# Patient Record
Sex: Male | Born: 1970 | Hispanic: No | Marital: Married | Smoking: Never smoker
Health system: Southern US, Community
[De-identification: ages and names within clinical notes are randomized; demographics above are authoritative.]

## PROBLEM LIST (undated history)

## (undated) DIAGNOSIS — T7840XA Allergy, unspecified, initial encounter: Secondary | ICD-10-CM

## (undated) DIAGNOSIS — R011 Cardiac murmur, unspecified: Secondary | ICD-10-CM

## (undated) HISTORY — DX: Allergy, unspecified, initial encounter: T78.40XA

## (undated) HISTORY — DX: Cardiac murmur, unspecified: R01.1

## (undated) HISTORY — PX: KNEE ARTHROSCOPY AND ARTHROTOMY: SUR84

---

## 2011-02-03 ENCOUNTER — Ambulatory Visit: Payer: Self-pay | Admitting: Family Medicine

## 2012-06-30 ENCOUNTER — Ambulatory Visit: Payer: Self-pay | Admitting: Family Medicine

## 2013-07-02 ENCOUNTER — Ambulatory Visit: Payer: Self-pay | Admitting: Family Medicine

## 2014-04-24 DIAGNOSIS — L03019 Cellulitis of unspecified finger: Secondary | ICD-10-CM | POA: Insufficient documentation

## 2015-07-10 IMAGING — CR LEFT WRIST - COMPLETE 3+ VIEW
1 series · 4 of 4 positions shown · non-contrast
Comparison: None.

CLINICAL DATA: Pain

EXAM:
LEFT WRIST - COMPLETE 3+ VIEW

[Series 1: pa · 0.17mm/px · 4 of 4 slices shown]
[im 1/4]
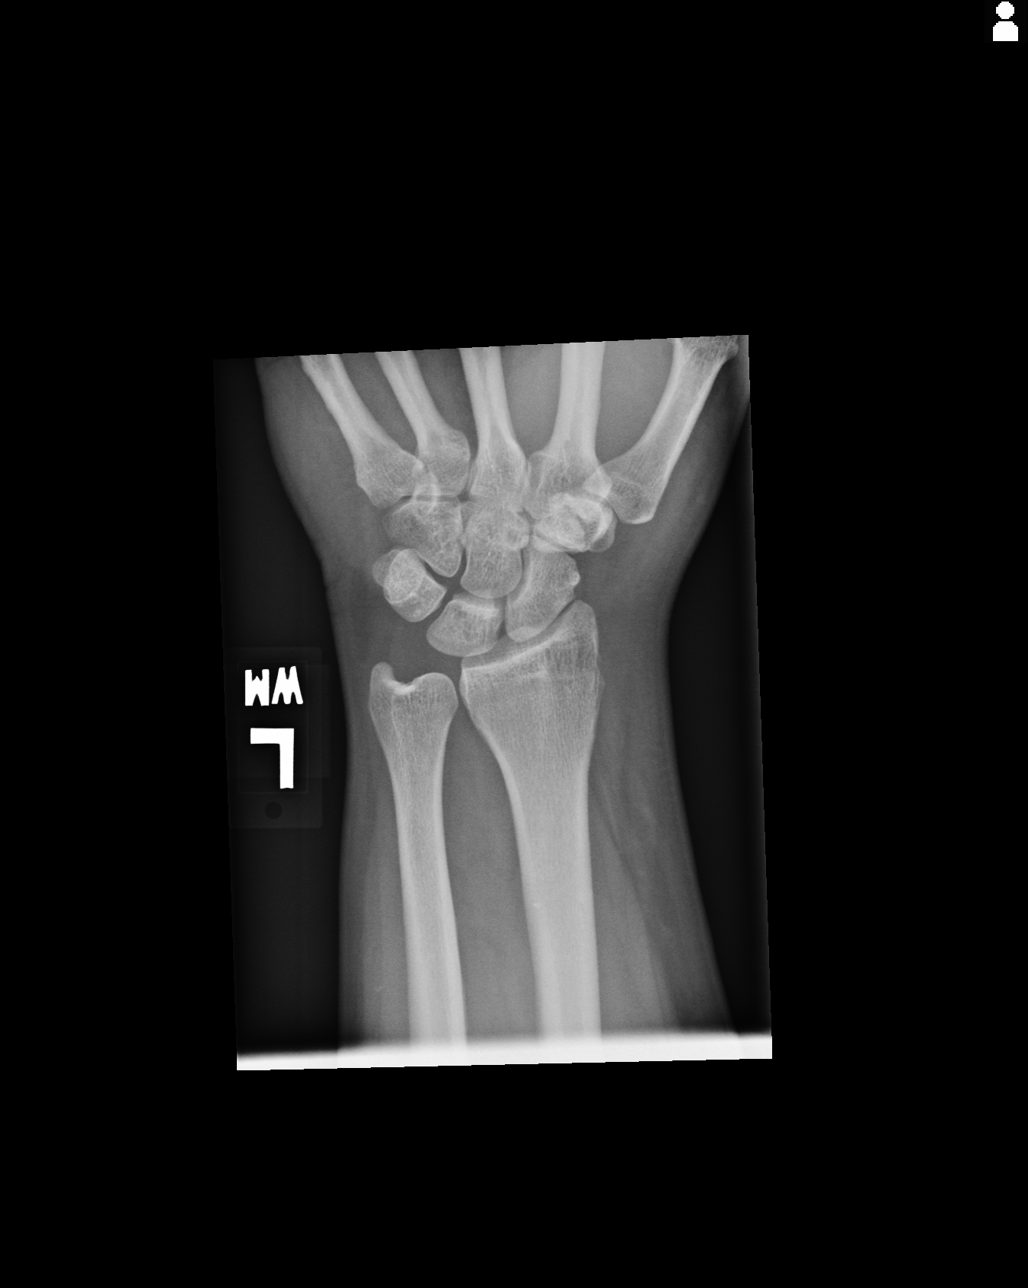
[im 2/4]
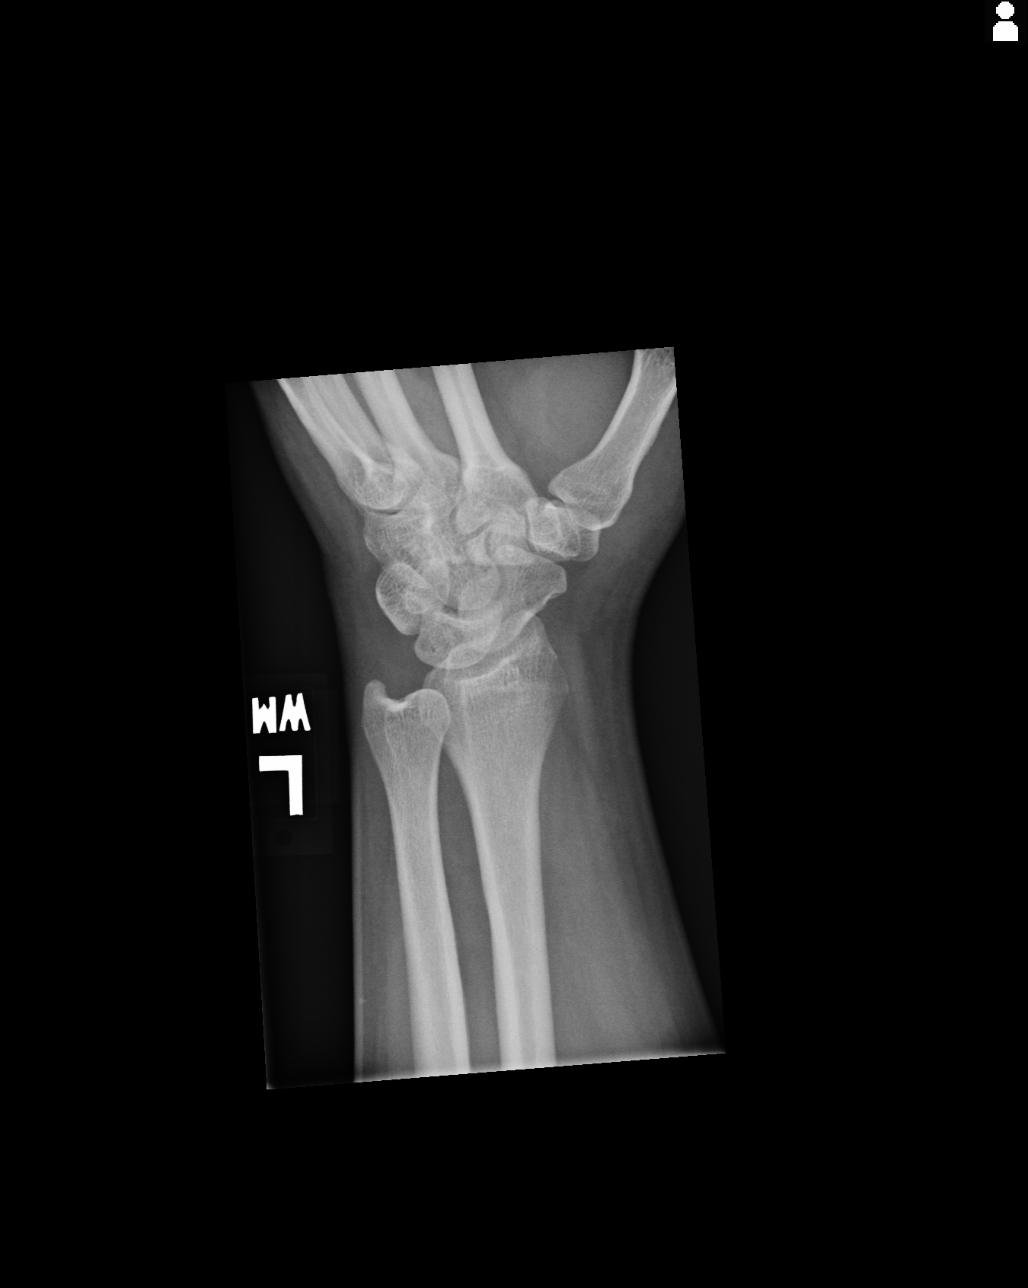
[im 3/4]
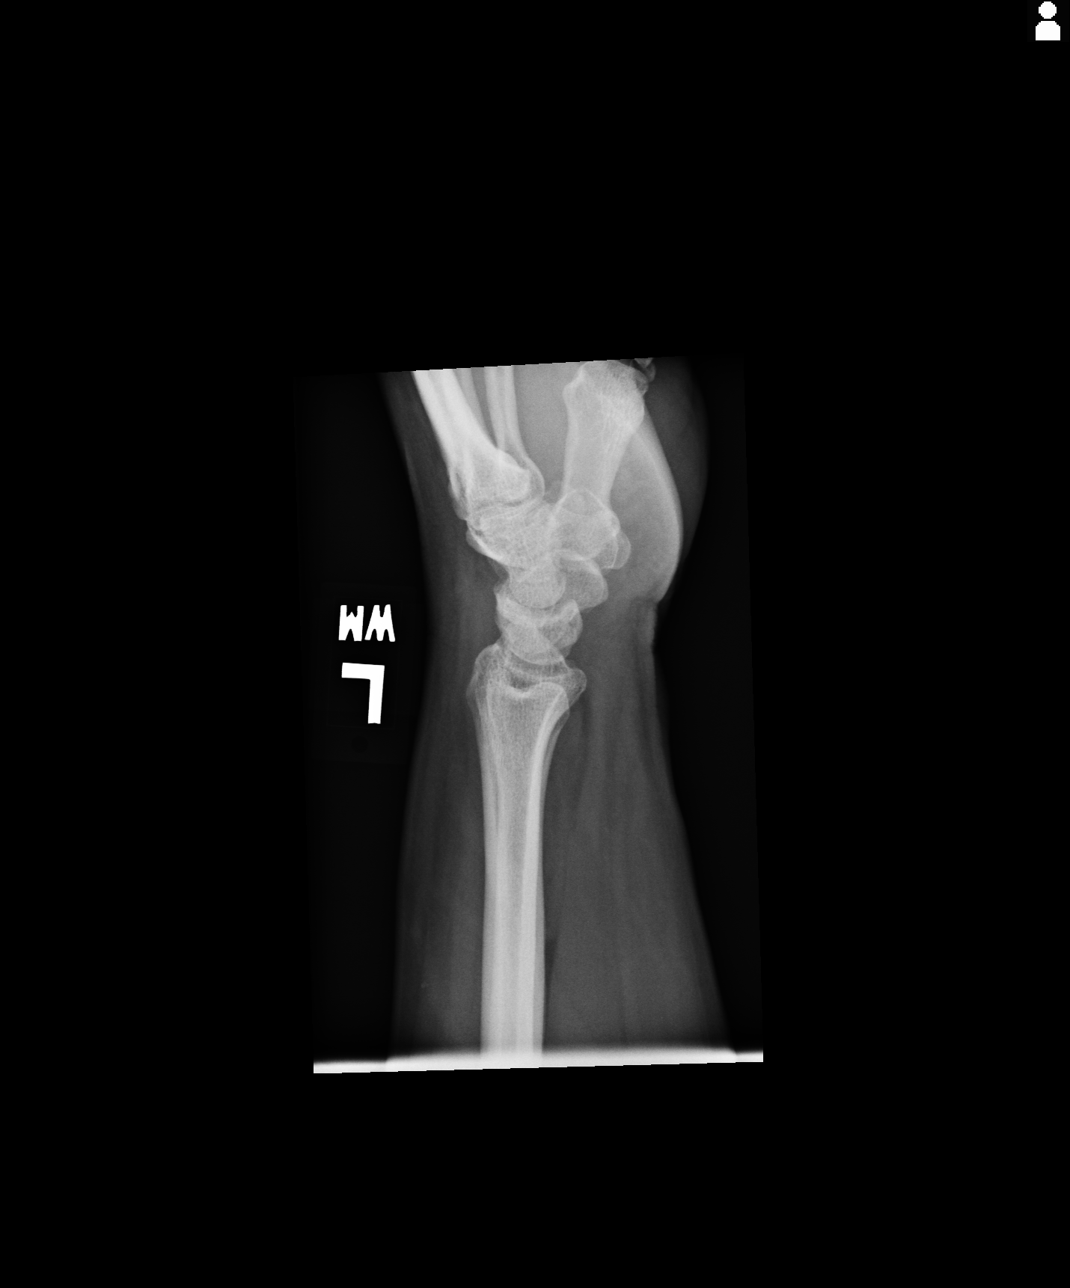
[im 4/4]
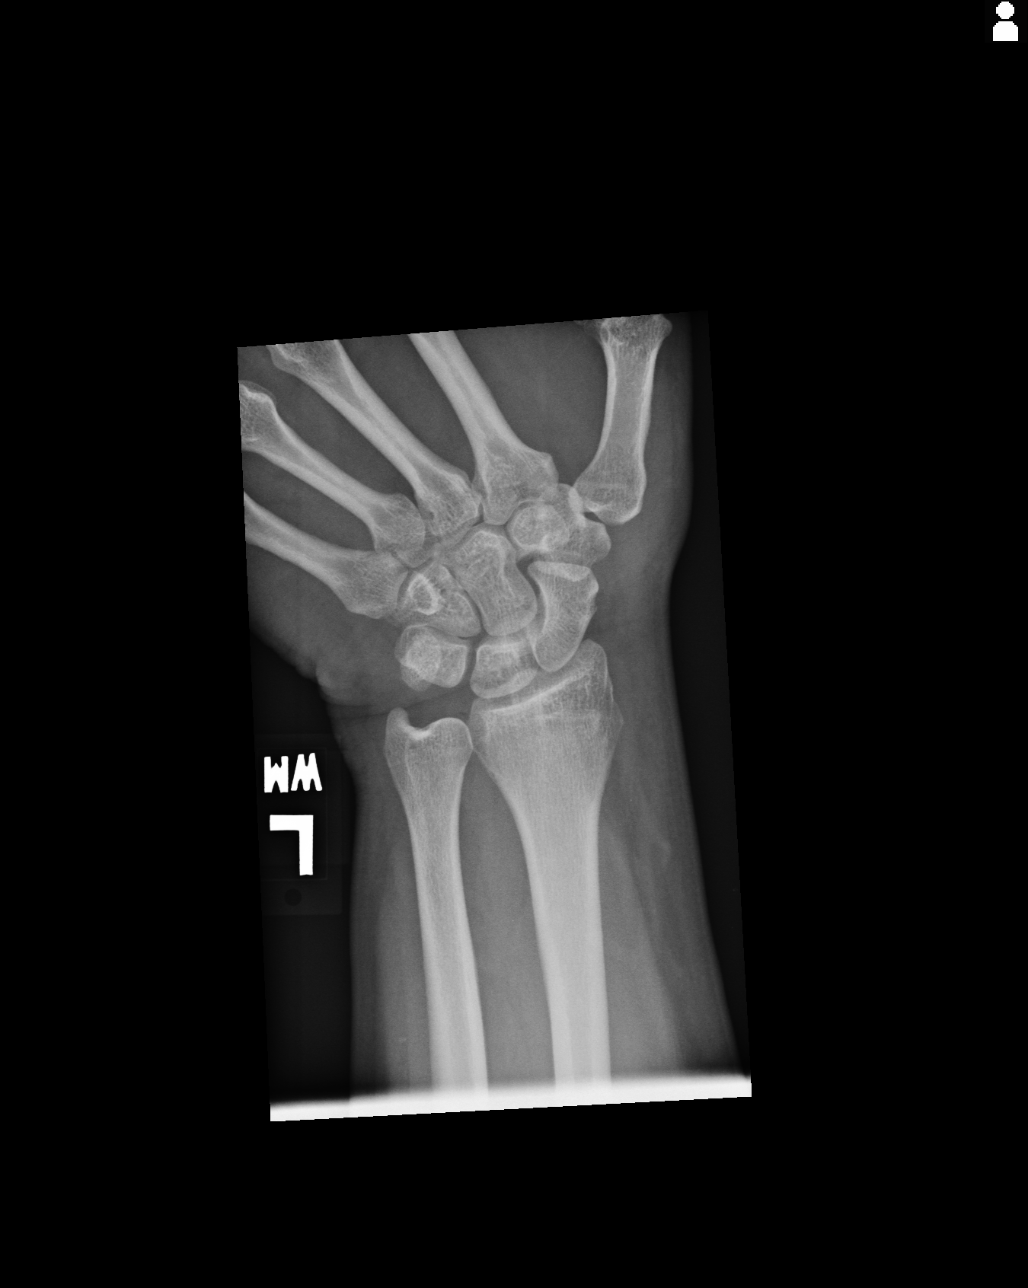

[4 of 4 positions shown; findings below may reference images not displayed]

FINDINGS: There is no evidence of fracture or dislocation. There is no
evidence of arthropathy or other focal bone abnormality. Soft
tissues are unremarkable.
IMPRESSION: Negative.

## 2015-09-30 DIAGNOSIS — D487 Neoplasm of uncertain behavior of other specified sites: Secondary | ICD-10-CM | POA: Insufficient documentation

## 2016-01-12 ENCOUNTER — Ambulatory Visit (INDEPENDENT_AMBULATORY_CARE_PROVIDER_SITE_OTHER): Payer: BLUE CROSS/BLUE SHIELD | Admitting: Family Medicine

## 2016-01-12 ENCOUNTER — Encounter: Payer: Self-pay | Admitting: Family Medicine

## 2016-01-12 DIAGNOSIS — E669 Obesity, unspecified: Secondary | ICD-10-CM

## 2016-01-12 DIAGNOSIS — L739 Follicular disorder, unspecified: Secondary | ICD-10-CM | POA: Diagnosis not present

## 2016-01-12 DIAGNOSIS — E1100 Type 2 diabetes mellitus with hyperosmolarity without nonketotic hyperglycemic-hyperosmolar coma (NKHHC): Secondary | ICD-10-CM | POA: Diagnosis not present

## 2016-01-12 DIAGNOSIS — Z6841 Body Mass Index (BMI) 40.0 and over, adult: Secondary | ICD-10-CM

## 2016-01-12 DIAGNOSIS — E1140 Type 2 diabetes mellitus with diabetic neuropathy, unspecified: Secondary | ICD-10-CM | POA: Insufficient documentation

## 2016-01-12 LAB — POCT GLYCOSYLATED HEMOGLOBIN (HGB A1C): Hemoglobin A1C: 9.2

## 2016-01-12 LAB — POCT CBG (FASTING - GLUCOSE)-MANUAL ENTRY: GLUCOSE FASTING, POC: 191 mg/dL — AB (ref 70–99)

## 2016-01-12 MED ORDER — DOXYCYCLINE HYCLATE 100 MG PO TABS: 100.0000 mg | ORAL_TABLET | Freq: Two times a day (BID) | ORAL | 0 refills | Status: DC

## 2016-01-12 MED ORDER — METFORMIN HCL 1000 MG PO TABS: 1000.0000 mg | ORAL_TABLET | Freq: Two times a day (BID) | ORAL | 3 refills | Status: DC

## 2016-01-12 NOTE — Patient Instructions (Signed)
Get Glucometer and check BSs twice daily

## 2016-01-12 NOTE — Progress Notes (Signed)
Name: Martin Sanders   MRN: [id number]    DOB: 12-07-1970   Date:01/12/2016       Progress Note  Subjective  Chief Complaint  Chief Complaint  Patient presents with  . Follow-up    pt has not been seen by PCP from 2015 wants to make sure everything is fine and get some lab work done.  . Rash    not healing and getting extreme fatigue and always thirsty    HPI Here for general health check.  He is overweight and concerned about DM.  Recently he has been very tired and stays thirsty.  Skin lesions having a hard time healing.  No visual changes.  Increased urination with nocturia.  No problem-specific Assessment & Plan notes found for this encounter.   Past Medical History:  Diagnosis Date  . Heart murmur     Past Surgical History:  Procedure Laterality Date  . KNEE ARTHROSCOPY AND ARTHROTOMY      History reviewed. No pertinent family history.  Social History   Social History  . Marital status: Married    Spouse name: N/A  . Number of children: N/A  . Years of education: N/A   Occupational History  . Not on file.   Social History Main Topics  . Smoking status: Never Smoker  . Smokeless tobacco: Never Used  . Alcohol use No  . Drug use: No  . Sexual activity: Not on file   Other Topics Concern  . Not on file   Social History Narrative  . No narrative on file     Current Outpatient Prescriptions:  .  cetirizine-pseudoephedrine (ZYRTEC-D) 5-120 MG tablet, Take by mouth., Disp: , Rfl:  .  doxycycline (VIBRA-TABS) 100 MG tablet, Take 1 tablet (100 mg total) by mouth 2 (two) times daily., Disp: 20 tablet, Rfl: 0 .  metFORMIN (GLUCOPHAGE) 1000 MG tablet, Take 1 tablet (1,000 mg total) by mouth 2 (two) times daily with a meal., Disp: 180 tablet, Rfl: 3  Not on File   Review of Systems  Constitutional: Positive for malaise/fatigue and weight loss. Negative for chills and fever.  HENT: Negative for hearing loss.   Eyes: Negative for blurred vision and double  vision.  Respiratory: Negative for cough, shortness of breath and wheezing.   Cardiovascular: Positive for palpitations (occ.) and leg swelling. Negative for chest pain.  Gastrointestinal: Negative for abdominal pain, blood in stool and heartburn.  Genitourinary: Positive for frequency. Negative for dysuria and urgency.  Musculoskeletal: Negative for joint pain and myalgias.  Skin: Positive for rash. Negative for itching.  Neurological: Positive for tremors (Mild in R hand only). Negative for dizziness, weakness and headaches.  Psychiatric/Behavioral: The patient is not nervous/anxious.       Objective  Vitals:   01/12/16 1606  BP: 123/86  Pulse: 83  Resp: 16  Temp: 99 F (37.2 C)  TempSrc: Oral  Weight: (!) 345 lb 8 oz (156.7 kg)  Height: 6\' 1"  (1.854 m)    Physical Exam  Constitutional: He is oriented to person, place, and time and well-developed, well-nourished, and in no distress. No distress.  HENT:  Head: Normocephalic and atraumatic.  Eyes: Conjunctivae and EOM are normal. Pupils are equal, round, and reactive to light. No scleral icterus.  Neck: Normal range of motion. Neck supple. Carotid bruit is not present. No thyromegaly present.  Cardiovascular: Normal rate, regular rhythm and normal heart sounds.  Exam reveals no gallop and no friction rub.   No murmur heard.  Pulmonary/Chest: Effort normal and breath sounds normal. No respiratory distress. He has no wheezes. He has no rales.  Abdominal: Soft. Bowel sounds are normal. He exhibits no distension and no mass. There is no tenderness.  Morbidly obese  Musculoskeletal: He exhibits no edema.  Lymphadenopathy:    He has no cervical adenopathy.  Neurological: He is alert and oriented to person, place, and time.  Skin:  Follicular lesions with excoriations and mild erythema  Vitals reviewed.      No results found for this or any previous visit (from the past 2160 hour(s)).   Assessment & Plan  Problem List  Items Addressed This Visit      Endocrine   T2DM (type 2 diabetes mellitus) ([place])   Relevant Medications   metFORMIN (GLUCOPHAGE) 1000 MG tablet   Other Relevant Orders   COMPLETE METABOLIC PANEL WITH GFR   Lipid Profile   POCT HgB A1C     Musculoskeletal and Integument   Folliculitis   Relevant Medications   doxycycline (VIBRA-TABS) 100 MG tablet   Other Relevant Orders   CBC with Differential     Other   Obesity   Relevant Medications   metFORMIN (GLUCOPHAGE) 1000 MG tablet   Other Relevant Orders   TSH   POCT CBG (Fasting - Glucose)    Other Visit Diagnoses   None.     Meds ordered this encounter  Medications  . cetirizine-pseudoephedrine (ZYRTEC-D) 5-120 MG tablet    Sig: Take by mouth.  . doxycycline (VIBRA-TABS) 100 MG tablet    Sig: Take 1 tablet (100 mg total) by mouth 2 (two) times daily.    Dispense:  20 tablet    Refill:  0  . metFORMIN (GLUCOPHAGE) 1000 MG tablet    Sig: Take 1 tablet (1,000 mg total) by mouth 2 (two) times daily with a meal.    Dispense:  180 tablet    Refill:  3   1. Obesity  - TSH - POCT CBG (Fasting - Glucose)-191  2. Folliculitis  - CBC with Differential - doxycycline (VIBRA-TABS) 100 MG tablet; Take 1 tablet (100 mg total) by mouth 2 (two) times daily.  Dispense: 20 tablet; Refill: 0  3. Type 2 diabetes mellitus with hyperosmolarity without coma, without long-term current use of insulin (HCC) - COMPLETE METABOLIC PANEL WITH GFR - Lipid Profile - metFORMIN (GLUCOPHAGE) 1000 MG tablet; Take 1 tablet (1,000 mg total) by mouth 2 (two) times daily with a meal.  Dispense: 180 tablet; Refill: 3 - POCT HgB A1C-9.2

## 2016-01-14 ENCOUNTER — Other Ambulatory Visit: Payer: BLUE CROSS/BLUE SHIELD

## 2016-01-15 ENCOUNTER — Other Ambulatory Visit: Payer: Self-pay | Admitting: Family Medicine

## 2016-01-15 LAB — COMPLETE METABOLIC PANEL WITH GFR
ALK PHOS: 86 U/L (ref 40–115)
ALT: 29 U/L (ref 9–46)
AST: 21 U/L (ref 10–40)
Albumin: 3.9 g/dL (ref 3.6–5.1)
BILIRUBIN TOTAL: 0.6 mg/dL (ref 0.2–1.2)
BUN: 11 mg/dL (ref 7–25)
CALCIUM: 9.3 mg/dL (ref 8.6–10.3)
CO2: 23 mmol/L (ref 20–31)
Chloride: 100 mmol/L (ref 98–110)
Creat: 0.64 mg/dL (ref 0.60–1.35)
GFR, Est African American: >89 mL/min (ref >=60)
GFR, Est Non African American: >89 mL/min (ref >=60)
GLUCOSE: 185 mg/dL — AB (ref 65–99)
Potassium: 4.5 mmol/L (ref 3.5–5.3)
SODIUM: 138 mmol/L (ref 135–146)
TOTAL PROTEIN: 6.6 g/dL (ref 6.1–8.1)

## 2016-01-15 LAB — TSH: TSH: 3.04 mIU/L (ref 0.40–4.50)

## 2016-01-15 LAB — CBC WITH DIFFERENTIAL/PLATELET
BASOS PCT: 0 %
Basophils Absolute: 0 cells/uL (ref 0–200)
Eosinophils Absolute: 231 cells/uL (ref 15–500)
Eosinophils Relative: 3 %
HEMATOCRIT: 47.3 % (ref 38.5–50.0)
HEMOGLOBIN: 16.1 g/dL (ref 13.2–17.1)
LYMPHS ABS: 1617 {cells}/uL (ref 850–3900)
Lymphocytes Relative: 21 %
MCH: 28 pg (ref 27.0–33.0)
MCHC: 34 g/dL (ref 32.0–36.0)
MCV: 82.1 fL (ref 80.0–100.0)
MONO ABS: 539 {cells}/uL (ref 200–950)
MPV: 9.9 fL (ref 7.5–12.5)
Monocytes Relative: 7 %
NEUTROS ABS: 5313 {cells}/uL (ref 1500–7800)
Neutrophils Relative %: 69 %
Platelets: 262 10*3/uL (ref 140–400)
RBC: 5.76 MIL/uL (ref 4.20–5.80)
RDW: 14.8 % (ref 11.0–15.0)
WBC: 7.7 10*3/uL (ref 3.8–10.8)

## 2016-01-15 LAB — LIPID PANEL
Cholesterol: 160 mg/dL (ref 125–200)
HDL: 46 mg/dL (ref >=40)
LDL Cholesterol: 90 mg/dL (ref <130)
Total CHOL/HDL Ratio: 3.5 Ratio (ref <=5.0)
Triglycerides: 120 mg/dL (ref <150)
VLDL: 24 mg/dL (ref <30)

## 2016-01-15 MED ORDER — GLUCOSE BLOOD VI STRP: ORAL_STRIP | 12 refills | Status: DC

## 2016-01-15 MED ORDER — BAYER CONTOUR NEXT EZ W/DEVICE KIT: 1.0000 | PACK | Freq: Two times a day (BID) | 0 refills | Status: AC

## 2016-01-15 MED ORDER — BAYER MICROLET LANCETS MISC: 12 refills | Status: DC

## 2016-01-19 ENCOUNTER — Telehealth: Payer: Self-pay | Admitting: Family Medicine

## 2016-01-19 NOTE — Telephone Encounter (Signed)
We'll see how sugars do on Metformin and then decide if he need to add any other medication.-jh

## 2016-01-19 NOTE — Telephone Encounter (Signed)
Pt. Called wanted to know if he needed a prescription called in for Lan[place] if so [facility] in [place] is his drug store  Pt call back # is  [phone]

## 2016-01-20 NOTE — Telephone Encounter (Signed)
Patient notified

## 2016-01-21 ENCOUNTER — Other Ambulatory Visit: Payer: Self-pay | Admitting: Family Medicine

## 2016-01-21 DIAGNOSIS — L739 Follicular disorder, unspecified: Secondary | ICD-10-CM

## 2016-01-22 ENCOUNTER — Other Ambulatory Visit: Payer: Self-pay | Admitting: Family Medicine

## 2016-01-22 DIAGNOSIS — L739 Follicular disorder, unspecified: Secondary | ICD-10-CM

## 2016-01-22 MED ORDER — DOXYCYCLINE HYCLATE 100 MG PO TABS: 100.0000 mg | ORAL_TABLET | Freq: Two times a day (BID) | ORAL | 0 refills | Status: DC

## 2016-01-27 ENCOUNTER — Ambulatory Visit (INDEPENDENT_AMBULATORY_CARE_PROVIDER_SITE_OTHER): Payer: BLUE CROSS/BLUE SHIELD | Admitting: Family Medicine

## 2016-01-27 ENCOUNTER — Encounter: Payer: Self-pay | Admitting: Family Medicine

## 2016-01-27 VITALS — BP 134/84 | HR 76 | Temp 98.6°F | Resp 16 | Ht 73.0 in | Wt 347.0 lb

## 2016-01-27 DIAGNOSIS — E1100 Type 2 diabetes mellitus with hyperosmolarity without nonketotic hyperglycemic-hyperosmolar coma (NKHHC): Secondary | ICD-10-CM

## 2016-01-27 NOTE — Progress Notes (Signed)
Name: Martin Sanders   MRN: [id number]    DOB: Apr 10, 1971   Date:01/27/2016       Progress Note  Subjective  Chief Complaint  Chief Complaint  Patient presents with  . Diabetes    HPI Here for f/u of DM.  He has not lost any weight. But is taking the Metformin, 1000 mg., bid.  He ha followed BSs.  BSs from 100-190 with most in 130s-160s.  No problem-specific Assessment & Plan notes found for this encounter.   Past Medical History:  Diagnosis Date  . Heart murmur     Past Surgical History:  Procedure Laterality Date  . KNEE ARTHROSCOPY AND ARTHROTOMY      History reviewed. No pertinent family history.  Social History   Social History  . Marital status: Married    Spouse name: N/A  . Number of children: N/A  . Years of education: N/A   Occupational History  . Not on file.   Social History Main Topics  . Smoking status: Never Smoker  . Smokeless tobacco: Never Used  . Alcohol use No  . Drug use: No  . Sexual activity: Not on file   Other Topics Concern  . Not on file   Social History Narrative  . No narrative on file     Current Outpatient Prescriptions:  .  BAYER MICROLET LANCETS lancets, Use with Bayer Contour meter to check blood sugar twice daily. Dx E11.9, Disp: 100 each, Rfl: 12 .  Blood Glucose Monitoring Suppl (CONTOUR NEXT EZ MONITOR) w/Device KIT, 1 each by Does not apply route 2 (two) times daily. Dx E11.9, Disp: 1 kit, Rfl: 0 .  cetirizine-pseudoephedrine (ZYRTEC-D) 5-120 MG tablet, Take by mouth., Disp: , Rfl:  .  doxycycline (VIBRA-TABS) 100 MG tablet, Take 1 tablet (100 mg total) by mouth 2 (two) times daily., Disp: 20 tablet, Rfl: 0 .  glucose blood (BAYER CONTOUR NEXT TEST) test strip, Check blood sugar twice daily. Dx: E11.9, Disp: 100 each, Rfl: 12 .  metFORMIN (GLUCOPHAGE) 1000 MG tablet, Take 1 tablet (1,000 mg total) by mouth 2 (two) times daily with a meal., Disp: 180 tablet, Rfl: 3  Not on File   Review of Systems  Constitutional:  Negative for chills, fever, malaise/fatigue and weight loss.  HENT: Negative for hearing loss.   Eyes: Negative for blurred vision and double vision.  Respiratory: Negative for cough, shortness of breath and wheezing.   Cardiovascular: Negative for chest pain, palpitations and leg swelling.  Gastrointestinal: Negative for blood in stool, diarrhea and heartburn.  Genitourinary: Negative for dysuria, frequency and urgency.  Skin: Negative for rash.  Neurological: Negative for dizziness, tremors, weakness and headaches.      Objective  Vitals:   01/27/16 1546  BP: 134/84  Pulse: 76  Resp: 16  Temp: 98.6 F (37 C)  TempSrc: Oral  Weight: (!) 347 lb (157.4 kg)  Height: 6' 1" (1.854 m)    Physical Exam  Constitutional: He is oriented to person, place, and time and well-developed, well-nourished, and in no distress. No distress.  HENT:  Head: Normocephalic and atraumatic.  Eyes: Conjunctivae and EOM are normal. Pupils are equal, round, and reactive to light. No scleral icterus.  Neck: Normal range of motion. Neck supple. Carotid bruit is not present. No thyromegaly present.  Cardiovascular: Normal rate, regular rhythm and normal heart sounds.  Exam reveals no gallop and no friction rub.   No murmur heard. Pulmonary/Chest: Effort normal and breath sounds normal. No  respiratory distress. He has no wheezes. He has no rales.  Abdominal: Soft. Bowel sounds are normal.  obese  Musculoskeletal: He exhibits no edema.  Lymphadenopathy:    He has no cervical adenopathy.  Neurological: He is alert and oriented to person, place, and time.  Vitals reviewed.      Recent Results (from the past 2160 hour(s))  COMPLETE METABOLIC PANEL WITH GFR     Status: Abnormal   Collection Time: 01/12/16  9:17 AM  Result Value Ref Range   Sodium 138 135 - 146 mmol/L   Potassium 4.5 3.5 - 5.3 mmol/L   Chloride 100 98 - 110 mmol/L   CO2 23 20 - 31 mmol/L   Glucose, Bld 185 (H) 65 - 99 mg/dL   BUN  11 7 - 25 mg/dL   Creat 0.64 0.60 - 1.35 mg/dL   Total Bilirubin 0.6 0.2 - 1.2 mg/dL   Alkaline Phosphatase 86 40 - 115 U/L   AST 21 10 - 40 U/L   ALT 29 9 - 46 U/L   Total Protein 6.6 6.1 - 8.1 g/dL   Albumin 3.9 3.6 - 5.1 g/dL   Calcium 9.3 8.6 - 10.3 mg/dL   GFR, Est African American >89 >=60 mL/min   GFR, Est Non African American >89 >=60 mL/min  Lipid Profile     Status: None   Collection Time: 01/12/16  9:17 AM  Result Value Ref Range   Cholesterol 160 125 - 200 mg/dL   Triglycerides 120 <150 mg/dL   HDL 46 >=40 mg/dL   Total CHOL/HDL Ratio 3.5 <=5.0 Ratio   VLDL 24 <30 mg/dL   LDL Cholesterol 90 <130 mg/dL    Comment:   Total Cholesterol/HDL Ratio:CHD Risk                        Coronary Heart Disease Risk Table                                        Men       Women          1/2 Average Risk              3.4        3.3              Average Risk              5.0        4.4           2X Average Risk              9.6        7.1           3X Average Risk             23.4       11.0 Use the calculated Patient Ratio above and the CHD Risk table  to determine the patient's CHD Risk.   CBC with Differential     Status: None   Collection Time: 01/12/16  9:17 AM  Result Value Ref Range   WBC 7.7 3.8 - 10.8 K/uL   RBC 5.76 4.20 - 5.80 MIL/uL   Hemoglobin 16.1 13.2 - 17.1 g/dL   HCT 47.3 38.5 - 50.0 %   MCV 82.1 80.0 - 100.0 fL   MCH 28.0 27.0 - 33.0   pg   MCHC 34.0 32.0 - 36.0 g/dL   RDW 14.8 11.0 - 15.0 %   Platelets 262 140 - 400 K/uL   MPV 9.9 7.5 - 12.5 fL   Neutro Abs 5,313 1,500 - 7,800 cells/uL   Lymphs Abs 1,617 850 - 3,900 cells/uL   Monocytes Absolute 539 200 - 950 cells/uL   Eosinophils Absolute 231 15 - 500 cells/uL   Basophils Absolute 0 0 - 200 cells/uL   Neutrophils Relative % 69 %   Lymphocytes Relative 21 %   Monocytes Relative 7 %   Eosinophils Relative 3 %   Basophils Relative 0 %   Smear Review Criteria for review not met     Comment: ** Please  note change in unit of measure and reference range(s). **  TSH     Status: None   Collection Time: 01/12/16  9:17 AM  Result Value Ref Range   TSH 3.04 0.40 - 4.50 mIU/L  POCT HgB A1C     Status: Abnormal   Collection Time: 01/12/16  4:51 PM  Result Value Ref Range   Hemoglobin A1C 9.2   POCT CBG (Fasting - Glucose)     Status: Abnormal   Collection Time: 01/12/16  4:52 PM  Result Value Ref Range   Glucose Fasting, POC 191 (A) 70 - 99 mg/dL     Assessment & Plan  Problem List Items Addressed This Visit      Endocrine   T2DM (type 2 diabetes mellitus) ([place]) - Primary    Other Visit Diagnoses   None.     No orders of the defined types were placed in this encounter.  .1. Type 2 diabetes mellitus with hyperosmolarity without coma, without long-term current use of insulin (HCC) Cont metformin

## 2016-05-03 ENCOUNTER — Ambulatory Visit (INDEPENDENT_AMBULATORY_CARE_PROVIDER_SITE_OTHER): Payer: BLUE CROSS/BLUE SHIELD | Admitting: Family Medicine

## 2016-05-03 ENCOUNTER — Encounter: Payer: Self-pay | Admitting: Family Medicine

## 2016-05-03 VITALS — BP 126/85 | HR 90 | Temp 98.2°F | Resp 16 | Ht 73.0 in | Wt 332.0 lb

## 2016-05-03 DIAGNOSIS — E1100 Type 2 diabetes mellitus with hyperosmolarity without nonketotic hyperglycemic-hyperosmolar coma (NKHHC): Secondary | ICD-10-CM | POA: Diagnosis not present

## 2016-05-03 DIAGNOSIS — Z6841 Body Mass Index (BMI) 40.0 and over, adult: Secondary | ICD-10-CM

## 2016-05-03 DIAGNOSIS — E6609 Other obesity due to excess calories: Secondary | ICD-10-CM | POA: Diagnosis not present

## 2016-05-03 DIAGNOSIS — IMO0001 Reserved for inherently not codable concepts without codable children: Secondary | ICD-10-CM

## 2016-05-03 MED ORDER — LISINOPRIL 5 MG PO TABS: 5.0000 mg | ORAL_TABLET | Freq: Every day | ORAL | 11 refills | Status: DC

## 2016-05-03 NOTE — Progress Notes (Signed)
Name: Martin Sanders   MRN: [id number]    DOB: Oct 02, 1970   Date:05/03/2016       Progress Note  Subjective  Chief Complaint  Chief Complaint  Patient presents with  . Follow-up    Diabetes     HPI Here for f/u of DM.  He has been watching diet and has lost weight.  BSs now usually in 115-150 range with few outside.  He is feeling pretty well. HE does c/o some diffuse numbness in bilateral feet.  Only rare sharp burning painful sensation.  No problem-specific Assessment & Plan notes found for this encounter.   Past Medical History:  Diagnosis Date  . Heart murmur     Past Surgical History:  Procedure Laterality Date  . KNEE ARTHROSCOPY AND ARTHROTOMY      History reviewed. No pertinent family history.  Social History   Social History  . Marital status: Married    Spouse name: N/A  . Number of children: N/A  . Years of education: N/A   Occupational History  . Not on file.   Social History Main Topics  . Smoking status: Never Smoker  . Smokeless tobacco: Never Used  . Alcohol use No     Comment: occasionally 2 glasses wine   . Drug use: No  . Sexual activity: Not on file   Other Topics Concern  . Not on file   Social History Narrative  . No narrative on file     Current Outpatient Prescriptions:  .  BAYER MICROLET LANCETS lancets, Use with Bayer Contour meter to check blood sugar twice daily. Dx E11.9, Disp: 100 each, Rfl: 12 .  Blood Glucose Monitoring Suppl (CONTOUR NEXT EZ MONITOR) w/Device KIT, 1 each by Does not apply route 2 (two) times daily. Dx E11.9, Disp: 1 kit, Rfl: 0 .  cetirizine-pseudoephedrine (ZYRTEC-D) 5-120 MG tablet, Take by mouth., Disp: , Rfl:  .  glucose blood (BAYER CONTOUR NEXT TEST) test strip, Check blood sugar twice daily. Dx: E11.9, Disp: 100 each, Rfl: 12 .  metFORMIN (GLUCOPHAGE) 1000 MG tablet, Take 1 tablet (1,000 mg total) by mouth 2 (two) times daily with a meal., Disp: 180 tablet, Rfl: 3 .  lisinopril (PRINIVIL,ZESTRIL) 5  MG tablet, Take 1 tablet (5 mg total) by mouth daily., Disp: 30 tablet, Rfl: 11  Not on File   Review of Systems  Constitutional: Positive for weight loss (desired). Negative for chills, fever and malaise/fatigue.  HENT: Negative for hearing loss and tinnitus.   Eyes: Negative for blurred vision and double vision.  Respiratory: Negative for cough, shortness of breath and wheezing.   Cardiovascular: Negative for chest pain, palpitations and leg swelling.  Gastrointestinal: Negative for abdominal pain, blood in stool and heartburn.  Genitourinary: Negative for dysuria, frequency and urgency.  Musculoskeletal: Negative for back pain, joint pain and myalgias.  Skin: Negative for rash.  Neurological: Negative for dizziness, tingling, tremors, weakness and headaches.      Objective  Vitals:   05/03/16 1513  BP: 126/85  Pulse: 90  Resp: 16  Temp: 98.2 F (36.8 C)  TempSrc: Oral  Weight: (!) 150.6 kg (332 lb)  Height: 6' 1" (1.854 m)    Physical Exam  Constitutional: He is oriented to person, place, and time and well-developed, well-nourished, and in no distress. No distress.  HENT:  Head: Normocephalic and atraumatic.  Eyes: Conjunctivae and EOM are normal. Pupils are equal, round, and reactive to light. No scleral icterus.  Neck: Normal range of  motion. Neck supple. Carotid bruit is not present. No thyromegaly present.  Cardiovascular: Normal rate, regular rhythm and normal heart sounds.  Exam reveals no gallop and no friction rub.   No murmur heard. Pulmonary/Chest: Effort normal and breath sounds normal. No respiratory distress. He has no wheezes. He has no rales.  Abdominal: Soft. Bowel sounds are normal. He exhibits no distension and no mass. There is no tenderness.  Musculoskeletal: He exhibits no edema.  Lymphadenopathy:    He has no cervical adenopathy.  Neurological: He is alert and oriented to person, place, and time.  Vitals reviewed.      No results found  for this or any previous visit (from the past 2160 hour(s)).   Assessment & Plan  Problem List Items Addressed This Visit      Endocrine   T2DM (type 2 diabetes mellitus) ([place]) - Primary   Relevant Medications   lisinopril (PRINIVIL,ZESTRIL) 5 MG tablet   Other Relevant Orders   HgB A1c     Other   Obesity      Meds ordered this encounter  Medications  . lisinopril (PRINIVIL,ZESTRIL) 5 MG tablet    Sig: Take 1 tablet (5 mg total) by mouth daily.    Dispense:  30 tablet    Refill:  11   1. Type 2 diabetes mellitus with hyperosmolarity without coma, without long-term current use of insulin (HCC) Cont Metformin - HgB A1c - lisinopril (PRINIVIL,ZESTRIL) 5 MG tablet; Take 1 tablet (5 mg total) by mouth daily.  Dispense: 30 tablet; Refill: 11  2. Class 3 obesity due to excess calories with body mass index (BMI) of 40.0 to 44.9 in adult, unspecified whether serious comorbidity present ([place])

## 2016-05-10 ENCOUNTER — Other Ambulatory Visit: Payer: BLUE CROSS/BLUE SHIELD

## 2016-05-11 LAB — HEMOGLOBIN A1C
Hgb A1c MFr Bld: 6.1 % — ABNORMAL HIGH (ref <5.7)
Mean Plasma Glucose: 128 mg/dL

## 2016-06-29 ENCOUNTER — Encounter: Payer: Self-pay | Admitting: Family Medicine

## 2016-06-29 ENCOUNTER — Ambulatory Visit (INDEPENDENT_AMBULATORY_CARE_PROVIDER_SITE_OTHER): Payer: BLUE CROSS/BLUE SHIELD | Admitting: Family Medicine

## 2016-06-29 VITALS — BP 125/79 | HR 79 | Temp 98.2°F | Resp 16 | Ht 73.0 in | Wt 321.0 lb

## 2016-06-29 DIAGNOSIS — E1100 Type 2 diabetes mellitus with hyperosmolarity without nonketotic hyperglycemic-hyperosmolar coma (NKHHC): Secondary | ICD-10-CM

## 2016-06-29 DIAGNOSIS — R05 Cough: Secondary | ICD-10-CM

## 2016-06-29 DIAGNOSIS — N529 Male erectile dysfunction, unspecified: Secondary | ICD-10-CM | POA: Insufficient documentation

## 2016-06-29 DIAGNOSIS — R058 Other specified cough: Secondary | ICD-10-CM

## 2016-06-29 DIAGNOSIS — T464X5A Adverse effect of angiotensin-converting-enzyme inhibitors, initial encounter: Secondary | ICD-10-CM

## 2016-06-29 MED ORDER — LOSARTAN POTASSIUM 25 MG PO TABS: 25.0000 mg | ORAL_TABLET | Freq: Every day | ORAL | 12 refills | Status: DC

## 2016-06-29 MED ORDER — SILDENAFIL CITRATE 20 MG PO TABS: ORAL_TABLET | ORAL | 6 refills | Status: DC

## 2016-06-29 NOTE — Progress Notes (Signed)
Name: Martin Sanders   MRN: [id number]    DOB: January 16, 1971   Date:06/29/2016       Progress Note  Subjective  Chief Complaint  Chief Complaint  Patient presents with  . Hypertension    Lisinpril side effects cough    HPI Here because of developed cough with Lisinopril after only several weeks of taking it.  Stopped Lisinopril 4-5 days ago and cough has almost totally resolved. OBTW- also c/o ED sx that he and his wife complain about.  No problem-specific Assessment & Plan notes found for this encounter.   Past Medical History:  Diagnosis Date  . Heart murmur     Past Surgical History:  Procedure Laterality Date  . KNEE ARTHROSCOPY AND ARTHROTOMY      History reviewed. No pertinent family history.  Social History   Social History  . Marital status: Married    Spouse name: N/A  . Number of children: N/A  . Years of education: N/A   Occupational History  . Not on file.   Social History Main Topics  . Smoking status: Never Smoker  . Smokeless tobacco: Never Used  . Alcohol use No     Comment: occasionally 2 glasses wine   . Drug use: No  . Sexual activity: Not on file   Other Topics Concern  . Not on file   Social History Narrative  . No narrative on file     Current Outpatient Prescriptions:  .  BAYER MICROLET LANCETS lancets, Use with Bayer Contour meter to check blood sugar twice daily. Dx E11.9, Disp: 100 each, Rfl: 12 .  Blood Glucose Monitoring Suppl (CONTOUR NEXT EZ MONITOR) w/Device KIT, 1 each by Does not apply route 2 (two) times daily. Dx E11.9, Disp: 1 kit, Rfl: 0 .  cetirizine-pseudoephedrine (ZYRTEC-D) 5-120 MG tablet, Take by mouth., Disp: , Rfl:  .  glucose blood (BAYER CONTOUR NEXT TEST) test strip, Check blood sugar twice daily. Dx: E11.9, Disp: 100 each, Rfl: 12 .  metFORMIN (GLUCOPHAGE) 1000 MG tablet, Take 1 tablet (1,000 mg total) by mouth 2 (two) times daily with a meal., Disp: 180 tablet, Rfl: 3 .  losartan (COZAAR) 25 MG tablet, Take 1  tablet (25 mg total) by mouth daily., Disp: 30 tablet, Rfl: 12 .  sildenafil (REVATIO) 20 MG tablet, Take 1-5 tablets as needed prior to sex., Disp: 50 tablet, Rfl: 6  Not on File   Review of Systems  Constitutional: Negative.   HENT: Negative.   Eyes: Negative.   Respiratory: Positive for cough.   Cardiovascular: Negative.   Gastrointestinal: Negative.   Genitourinary:       ED problems  Musculoskeletal: Negative.   Skin: Negative.   Neurological: Negative.       Objective  Vitals:   06/29/16 1050  BP: 125/79  Pulse: 79  Resp: 16  Temp: 98.2 F (36.8 C)  TempSrc: Oral  Weight: (!) 321 lb (145.6 kg)  Height: _0  (1.854 m)    Physical Exam  Constitutional: He is well-developed, well-nourished, and in no distress. No distress.  HENT:  Head: Normocephalic and atraumatic.  Cardiovascular: Normal rate, regular rhythm and normal heart sounds.  Exam reveals no gallop and no friction rub.   No murmur heard. Pulmonary/Chest: Effort normal and breath sounds normal. No respiratory distress. He has no wheezes. He has no rales.  Genitourinary:  Genitourinary Comments: Reports ED  Vitals reviewed.      Recent Results (from the past 2160 hour(s))  HgB A1c     Status: Abnormal   Collection Time: 05/10/16 12:01 AM  Result Value Ref Range   Hgb A1c MFr Bld 6.1 (H) <5.7 %    Comment:   For someone without known diabetes, a hemoglobin A1c value between 5.7% and 6.4% is consistent with prediabetes and should be confirmed with a follow-up test.   For someone with known diabetes, a value <7% indicates that their diabetes is well controlled. A1c targets should be individualized based on duration of diabetes, age, co-morbid conditions and other considerations.   This assay result is consistent with an increased risk of diabetes.   Currently, no consensus exists regarding use of hemoglobin A1c for diagnosis of diabetes in children.      Mean Plasma Glucose 128 mg/dL      Assessment & Plan  Problem List Items Addressed This Visit      Endocrine   T2DM (type 2 diabetes mellitus) () - Primary   Relevant Medications   losartan (COZAAR) 25 MG tablet     Genitourinary   ED (erectile dysfunction)     Other   ACE-inhibitor cough   Relevant Medications   sildenafil (REVATIO) 20 MG tablet      Meds ordered this encounter  Medications  . losartan (COZAAR) 25 MG tablet    Sig: Take 1 tablet (25 mg total) by mouth daily.    Dispense:  30 tablet    Refill:  12  . sildenafil (REVATIO) 20 MG tablet    Sig: Take 1-5 tablets as needed prior to sex.    Dispense:  50 tablet    Refill:  6   1. Type 2 diabetes mellitus with hyperosmolarity without coma, without long-term current use of insulin (HCC) Stop Lisinopril-ACE cough - losartan (COZAAR) 25 MG tablet; Take 1 tablet (25 mg total) by mouth daily.  Dispense: 30 tablet; Refill: 12  2. Erectile dysfunction, unspecified erectile dysfunction type - sildenafil (REVATIO) 20 MG tablet; Take 1-5 tablets as needed prior to sex.  Dispense: 50 tablet; Refill: 6

## 2016-08-10 ENCOUNTER — Encounter: Payer: Self-pay | Admitting: Family Medicine

## 2016-08-10 ENCOUNTER — Ambulatory Visit (INDEPENDENT_AMBULATORY_CARE_PROVIDER_SITE_OTHER): Payer: BLUE CROSS/BLUE SHIELD | Admitting: Family Medicine

## 2016-08-10 VITALS — BP 116/78 | HR 81 | Temp 98.6°F | Resp 16 | Ht 73.0 in | Wt 320.0 lb

## 2016-08-10 DIAGNOSIS — Z6841 Body Mass Index (BMI) 40.0 and over, adult: Secondary | ICD-10-CM

## 2016-08-10 DIAGNOSIS — L739 Follicular disorder, unspecified: Secondary | ICD-10-CM

## 2016-08-10 DIAGNOSIS — E1100 Type 2 diabetes mellitus with hyperosmolarity without nonketotic hyperglycemic-hyperosmolar coma (NKHHC): Secondary | ICD-10-CM | POA: Diagnosis not present

## 2016-08-10 DIAGNOSIS — I10 Essential (primary) hypertension: Secondary | ICD-10-CM

## 2016-08-10 DIAGNOSIS — IMO0001 Reserved for inherently not codable concepts without codable children: Secondary | ICD-10-CM

## 2016-08-10 DIAGNOSIS — E6609 Other obesity due to excess calories: Secondary | ICD-10-CM

## 2016-08-10 MED ORDER — DOXYCYCLINE HYCLATE 100 MG PO TABS: 100.0000 mg | ORAL_TABLET | Freq: Two times a day (BID) | ORAL | 3 refills | Status: DC

## 2016-08-10 NOTE — Addendum Note (Signed)
Addended by: Coralie Common on: 08/10/2016 10:52 AM   Modules accepted: Orders

## 2016-08-10 NOTE — Progress Notes (Signed)
Name: Martin Sanders   MRN: [id number]    DOB: September 01, 1970   Date:08/10/2016       Progress Note  Subjective  Chief Complaint  Chief Complaint  Patient presents with  . Diabetes  . Hypertension    HPI  Also c/o folliculitis on forearms that gets infected. No problem-specific Assessment & Plan notes found for this encounter.   Past Medical History:  Diagnosis Date  . Heart murmur     Past Surgical History:  Procedure Laterality Date  . KNEE ARTHROSCOPY AND ARTHROTOMY      History reviewed. No pertinent family history.  Social History   Social History  . Marital status: Married    Spouse name: N/A  . Number of children: N/A  . Years of education: N/A   Occupational History  . Not on file.   Social History Main Topics  . Smoking status: Never Smoker  . Smokeless tobacco: Never Used  . Alcohol use No     Comment: occasionally 2 glasses wine   . Drug use: No  . Sexual activity: Not on file   Other Topics Concern  . Not on file   Social History Narrative  . No narrative on file     Current Outpatient Prescriptions:  .  BAYER MICROLET LANCETS lancets, Use with Bayer Contour meter to check blood sugar twice daily. Dx E11.9, Disp: 100 each, Rfl: 12 .  Blood Glucose Monitoring Suppl (CONTOUR NEXT EZ MONITOR) w/Device KIT, 1 each by Does not apply route 2 (two) times daily. Dx E11.9, Disp: 1 kit, Rfl: 0 .  cetirizine-pseudoephedrine (ZYRTEC-D) 5-120 MG tablet, Take by mouth., Disp: , Rfl:  .  glucose blood (BAYER CONTOUR NEXT TEST) test strip, Check blood sugar twice daily. Dx: E11.9, Disp: 100 each, Rfl: 12 .  losartan (COZAAR) 25 MG tablet, Take 1 tablet (25 mg total) by mouth daily., Disp: 30 tablet, Rfl: 12 .  metFORMIN (GLUCOPHAGE) 1000 MG tablet, Take 1 tablet (1,000 mg total) by mouth 2 (two) times daily with a meal., Disp: 180 tablet, Rfl: 3 .  sildenafil (REVATIO) 20 MG tablet, Take 1-5 tablets as needed prior to sex., Disp: 50 tablet, Rfl: 6 .  doxycycline  (VIBRA-TABS) 100 MG tablet, Take 1 tablet (100 mg total) by mouth 2 (two) times daily., Disp: 20 tablet, Rfl: 3  Not on File   Review of Systems  Skin:       Folliculitis bilateral forearms      Objective  Vitals:   08/10/16 1013  BP: 116/78  Pulse: 81  Resp: 16  Temp: 98.6 F (37 C)  TempSrc: Oral  Weight: (!) 320 lb (145.2 kg)  Height: _0  (1.854 m)    Physical Exam  Skin:  Forearms with scabbed areas, redness and discoloration of skin       No results found for this or any previous visit (from the past 2160 hour(s)).   Assessment & Plan  Problem List Items Addressed This Visit      Cardiovascular and Mediastinum   HBP (high blood pressure)     Endocrine   T2DM (type 2 diabetes mellitus) ([place]) - Primary     Musculoskeletal and Integument   Folliculitis   Relevant Medications   doxycycline (VIBRA-TABS) 100 MG tablet     Other   Obesity      Meds ordered this encounter  Medications  . DISCONTD: lisinopril (PRINIVIL,ZESTRIL) 5 MG tablet    Sig: Take 5 mg by mouth daily.  Marland Kitchen  doxycycline (VIBRA-TABS) 100 MG tablet    Sig: Take 1 tablet (100 mg total) by mouth 2 (two) times daily.    Dispense:  20 tablet    Refill:  3   1. Essential hypertension   2. Type 2 diabetes mellitus with hyperosmolarity without coma, without long-term current use of insulin (HCC)   3. Class 3 obesity due to excess calories with body mass index (BMI) of 40.0 to 44.9 in adult, unspecified whether serious comorbidity present ([place])   4. Folliculitis  - doxycycline (VIBRA-TABS) 100 MG tablet; Take 1 tablet (100 mg total) by mouth 2 (two) times daily.  Dispense: 20 tablet; Refill: 3

## 2016-08-10 NOTE — Progress Notes (Signed)
Name: Martin Sanders   MRN: [id number]    DOB: 1971/03/24   Date:08/10/2016       Progress Note  Subjective  Chief Complaint  Chief Complaint  Patient presents with  . Diabetes  . Hypertension    HPI Here for f/u of HBP and DM.  His ACE cough has resolved.  His BSs have run from 90-150, with most <130.  No problem-specific Assessment & Plan notes found for this encounter.   Past Medical History:  Diagnosis Date  . Heart murmur     Past Surgical History:  Procedure Laterality Date  . KNEE ARTHROSCOPY AND ARTHROTOMY      History reviewed. No pertinent family history.  Social History   Social History  . Marital status: Married    Spouse name: N/A  . Number of children: N/A  . Years of education: N/A   Occupational History  . Not on file.   Social History Main Topics  . Smoking status: Never Smoker  . Smokeless tobacco: Never Used  . Alcohol use No     Comment: occasionally 2 glasses wine   . Drug use: No  . Sexual activity: Not on file   Other Topics Concern  . Not on file   Social History Narrative  . No narrative on file     Current Outpatient Prescriptions:  .  BAYER MICROLET LANCETS lancets, Use with Bayer Contour meter to check blood sugar twice daily. Dx E11.9, Disp: 100 each, Rfl: 12 .  Blood Glucose Monitoring Suppl (CONTOUR NEXT EZ MONITOR) w/Device KIT, 1 each by Does not apply route 2 (two) times daily. Dx E11.9, Disp: 1 kit, Rfl: 0 .  cetirizine-pseudoephedrine (ZYRTEC-D) 5-120 MG tablet, Take by mouth., Disp: , Rfl:  .  glucose blood (BAYER CONTOUR NEXT TEST) test strip, Check blood sugar twice daily. Dx: E11.9, Disp: 100 each, Rfl: 12 .  losartan (COZAAR) 25 MG tablet, Take 1 tablet (25 mg total) by mouth daily., Disp: 30 tablet, Rfl: 12 .  metFORMIN (GLUCOPHAGE) 1000 MG tablet, Take 1 tablet (1,000 mg total) by mouth 2 (two) times daily with a meal., Disp: 180 tablet, Rfl: 3 .  sildenafil (REVATIO) 20 MG tablet, Take 1-5 tablets as needed prior  to sex., Disp: 50 tablet, Rfl: 6  Not on File   Review of Systems  Constitutional: Negative for chills, fever, malaise/fatigue and weight loss.  HENT: Negative for hearing loss and tinnitus.   Eyes: Negative for blurred vision and double vision.  Respiratory: Negative for cough, shortness of breath and wheezing.   Cardiovascular: Negative for chest pain, palpitations and leg swelling.  Gastrointestinal: Negative for abdominal pain, blood in stool and heartburn.  Genitourinary: Negative for dysuria, frequency and urgency.  Musculoskeletal: Negative for joint pain and myalgias.  Skin: Negative for rash.  Neurological: Negative for dizziness, tingling, tremors, weakness and headaches.      Objective  Vitals:   08/10/16 1013  BP: 116/78  Pulse: 81  Resp: 16  Temp: 98.6 F (37 C)  TempSrc: Oral  Weight: (!) 320 lb (145.2 kg)  Height: _0  (1.854 m)    Physical Exam  Constitutional: He is oriented to person, place, and time and well-developed, well-nourished, and in no distress. No distress.  HENT:  Head: Normocephalic and atraumatic.  Mouth/Throat: No oropharyngeal exudate.  Eyes: Conjunctivae and EOM are normal. Pupils are equal, round, and reactive to light. No scleral icterus.  Neck: Normal range of motion. Neck supple. Carotid bruit is  not present. No thyromegaly present.  Cardiovascular: Normal rate, regular rhythm and normal heart sounds.  Exam reveals no gallop and no friction rub.   No murmur heard. Pulmonary/Chest: Effort normal and breath sounds normal. No respiratory distress. He has no wheezes. He has no rales.  Musculoskeletal: He exhibits no edema.  Lymphadenopathy:    He has no cervical adenopathy.  Neurological: He is alert and oriented to person, place, and time.  Vitals reviewed.      No results found for this or any previous visit (from the past 2160 hour(s)).   Assessment & Plan  Problem List Items Addressed This Visit      Cardiovascular  and Mediastinum   HBP (high blood pressure)     Endocrine   T2DM (type 2 diabetes mellitus) ([place]) - Primary     Other   Obesity      Meds ordered this encounter  Medications  . DISCONTD: lisinopril (PRINIVIL,ZESTRIL) 5 MG tablet    Sig: Take 5 mg by mouth daily.   1. Essential hypertension Cont Losartan  2. Type 2 diabetes mellitus with hyperosmolarity without coma, without long-term current use of insulin (HCC) Cont Metformin  3. Class 3 obesity due to excess calories with body mass index (BMI) of 40.0 to 44.9 in adult, unspecified whether serious comorbidity present ([place]) Cont good weight loss.

## 2016-10-12 LAB — HM DIABETES EYE EXAM

## 2016-11-10 ENCOUNTER — Ambulatory Visit (INDEPENDENT_AMBULATORY_CARE_PROVIDER_SITE_OTHER): Payer: BLUE CROSS/BLUE SHIELD | Admitting: Family Medicine

## 2016-11-10 ENCOUNTER — Ambulatory Visit: Payer: BLUE CROSS/BLUE SHIELD | Admitting: Family Medicine

## 2016-11-10 ENCOUNTER — Encounter: Payer: Self-pay | Admitting: Family Medicine

## 2016-11-10 VITALS — BP 106/52 | HR 52 | Temp 98.2°F | Resp 18 | Ht 73.0 in | Wt 320.6 lb

## 2016-11-10 DIAGNOSIS — E1140 Type 2 diabetes mellitus with diabetic neuropathy, unspecified: Secondary | ICD-10-CM | POA: Diagnosis not present

## 2016-11-10 DIAGNOSIS — Z6841 Body Mass Index (BMI) 40.0 and over, adult: Secondary | ICD-10-CM

## 2016-11-10 DIAGNOSIS — D487 Neoplasm of uncertain behavior of other specified sites: Secondary | ICD-10-CM

## 2016-11-10 DIAGNOSIS — L739 Follicular disorder, unspecified: Secondary | ICD-10-CM | POA: Diagnosis not present

## 2016-11-10 DIAGNOSIS — Z8679 Personal history of other diseases of the circulatory system: Secondary | ICD-10-CM | POA: Diagnosis not present

## 2016-11-10 LAB — POCT GLYCOSYLATED HEMOGLOBIN (HGB A1C): Hemoglobin A1C: 5.9 — AB (ref ?–5.7)

## 2016-11-10 MED ORDER — ALPHA-LIPOIC ACID 100 MG PO CAPS: 100.0000 mg | ORAL_CAPSULE | Freq: Every day | ORAL | 0 refills | Status: AC

## 2016-11-10 MED ORDER — METFORMIN HCL 1000 MG PO TABS: 1000.0000 mg | ORAL_TABLET | Freq: Two times a day (BID) | ORAL | 3 refills | Status: DC

## 2016-11-10 MED ORDER — GLUCOSE BLOOD VI STRP: ORAL_STRIP | 12 refills | Status: DC

## 2016-11-10 NOTE — Patient Instructions (Addendum)
Thank you for coming to the clinic today.   1.  Keep up the great work!  A1c trend in past 9.2 (01/2016) > 6.1 (05/2016) > 5.9 (11/2016)  Future recommendations for all Diabetic patients:  Think about starting Statin cholesterol medication - can do low dose since your cholesterol is normal range. Discussed your future ASCVD 10 yr risk score is about 3%, which is LOW, but as diabetic still recommend to take this preventative (options are Rosuvastatin or Crestor, 5-10 mg nightly - or can consider intermittent dosing 2-3x weekly, if intolerance with muscle aches and joint pain)  Also can add daily baby Aspirin 81mg  - to further reduce risk ASCVD  Due for Pneumonia vaccine (Pneumovax-23 PPSV) due < age 20 for diabetic patients - then all patients age 81 get prevnar and then booster in 1 year.  TDap in future good for 10 years  ----------------------------------------------------  You will be due for FASTING BLOOD WORK (no food or drink after midnight before, only water or coffee without cream/sugar on the morning of)  - Please go ahead and schedule a "Lab Only" visit in the morning at the clinic for lab draw in 6 months  For Lab Results, once available within 2-3 days of blood draw, you can can log in to MyChart online to view your results and a brief explanation. Also, we can discuss results at next follow-up visit.  Please schedule a Follow-up Appointment to: Return in about 6 months (around 05/12/2017) for Annual Physical.  If you have any other questions or concerns, please feel free to call the clinic or send a message through [facility]. You may also schedule an earlier appointment if necessary.  [name], DO [place]

## 2016-11-10 NOTE — Progress Notes (Signed)
Subjective:    Patient ID: Martin Sanders, male    DOB: February 27, 1971, 46 y.o.   MRN: [id number]  Martin Sanders is a 46 y.o. male presenting on 11/10/2016 for Diabetes (highest 149 and lowest 103)   HPI   CHRONIC DM, Type 2 with Mild Peripheral Neuropathy Reports prior history of new diagnosis Type 2 Diabetes in 01/2016, diagnosed with symptoms of polyuria, fatigue, and foot pain and he was diagnosed with A1c >9 at that time, has since dramatically improved. - No recent concerns, believes he is doing well. CBGs: Avg 120, Low 80s (felt mild hypoglycemic symptoms felt weaker), High 149. Checks CBGs 2x daily Meds: Metformin 1000mg  BID wc (adjusted now, without GI intolerance) Reports good compliance. Tolerating well w/o side-effects Currently on ARB Lifestyle: - Diet (Does admit to some inc carbs in morning with biscuit. He has reduced portion size, and now doing more home cooking. No sugar diet, no longer drinks sweet tea and sodas, mostly water and occasional sprite zero / coke zero rarely)  - Exercise (plan to work on improving, will anticipate swimming in home pool, walking daily at work, also walking with wife, who has changed lifestyle too) - Weight down 25-30 lbs in 6-9 months after initial management of diabetes. Now only 1 lb down in 3 months - Family history of obesity in family, no known diabetes in family, [place] heritage Denies hypoglycemia, polyuria, visual changes, numbness or tingling.  History of R eye with abnormal spot, concern for a benign neoplasm in back of R eye without any significant change in size or shape in past 1.5 years, followed by [facility]. Last visit 10/2016 was told no diabetic retinopathy  PMH - history of left forearm pustular folliculitis, has had recurrent trials on doxycycline and Hibiclens, had been followed by Dermatology in past.  Social History  Substance Use Topics  . Smoking status: Never Smoker  . Smokeless tobacco: Never Used  . Alcohol use  No     Comment: occasionally 2 glasses wine     Review of Systems Per HPI unless specifically indicated above     Objective:    BP (!) 106/52   Pulse (!) 52   Temp 98.2 F (36.8 C) (Oral)   Resp 18   Ht 6\' 1"  (1.854 m)   Wt (!) 320 lb 9.6 oz (145.4 kg)   BMI 42.30 kg/m   Wt Readings from Last 3 Encounters:  11/10/16 (!) 320 lb 9.6 oz (145.4 kg)  08/10/16 (!) 320 lb (145.2 kg)  06/29/16 (!) 321 lb (145.6 kg)    Physical Exam  Constitutional: He is oriented to person, place, and time. He appears well-developed and well-nourished. No distress.  Well-appearing, comfortable, cooperative, obese  HENT:  Head: Normocephalic and atraumatic.  Mouth/Throat: Oropharynx is clear and moist.  Eyes: Conjunctivae are normal. Right eye exhibits no discharge. Left eye exhibits no discharge.  Neck: Normal range of motion. Neck supple. No thyromegaly present.  Cardiovascular: Normal rate, regular rhythm, normal heart sounds and intact distal pulses.   No murmur heard. Pulmonary/Chest: Effort normal and breath sounds normal. No respiratory distress. He has no wheezes. He has no rales.  Musculoskeletal: He exhibits no edema.  Neurological: He is alert and oriented to person, place, and time.  Skin: Skin is warm and dry. No rash noted. He is not diaphoretic. No erythema.  Psychiatric: He has a normal mood and affect. His behavior is normal.  Well groomed, good eye contact, normal  speech and thoughts  Nursing note and vitals reviewed.    Diabetic Foot Exam - Simple   Simple Foot Form Diabetic Foot exam was performed with the following findings:  Yes 11/10/2016 10:06 AM  Visual Inspection No deformities, no ulcerations, no other skin breakdown bilaterally:  Yes Sensation Testing Intact to touch and monofilament testing bilaterally:  Yes See comments:  Yes Pulse Check Posterior Tibialis and Dorsalis pulse intact bilaterally:  Yes Comments Very minimal Right MTP reduced sensation to  monofilament     Results for orders placed or performed in visit on 11/10/16  POCT HgB A1C  Result Value Ref Range   Hemoglobin A1C 5.9 (A) 5.7  HM DIABETES EYE EXAM  Result Value Ref Range   HM Diabetic Eye Exam No Retinopathy No Retinopathy    Recent Labs  01/12/16 1651 05/10/16 0001 11/10/16 1003  HGBA1C 9.2 6.1* 5.9*       Assessment & Plan:   Problem List Items Addressed This Visit    Neoplasm of uncertain behavior of choroid    Stable without change. Suspected to be benign Followed by [facility]      Morbid obesity with BMI of 40.0-44.9, adult ([place])    Stable chronic abnormal weight with morbid obesity, had dramatic improvement with up to 30 lb wt loss in 6-9 months due to DM diet and lifestyle changes, now seems stagnant weight for past 3 months only change 1 lb - Complication with new DM2, genetic component of obesity  Plan: 1. Encouraged overall success so far with weight management - continue to treat underlying DM2, reassuring with lifestyle diet and goal to work on improving regular exercise, planned swimming 2. No additional med therapy needed at this time - consider future GLP1 if needed for DM control or switch for better wt loss 3. Not needed nutritionist, offered but he does not demonstrate need at this time 4. Follow-up q 6 months wt check - discussed option of future referral to [facility] Dr Leafy Ro as additional resource if still unsuccessful with further wt loss      Relevant Medications   metFORMIN (GLUCOPHAGE) 1000 MG tablet   History of heart murmur in childhood    Reported history. Has been stable. No concerns on exam. No murmur auscultated today      Folliculitis    Resolving, had chronic recurrence with poor healing, Left forearm Treated with Doxycycline, repeat rounds of antibiotics and topical hibiclens Followed by [facility] (last visit 03/2016)      Controlled type 2 diabetes mellitus with diabetic  neuropathy ([place]) - Primary    Well-controlled DM with A1c 5.9 (stable from 6.1 but overall dramatic improvement from initial dx 9.2 in 10/9933) Complication with mild peripheral neuropathy - more localized to R foot but seems to be resolving.  Plan:  1. Continue current therapy - Metformin 1000mg  BID 2. Encourage improved lifestyle - low carb, low sugar diet, reduce portion size, continue improving emphasis on regular exercise 3. Continue to check CBG up to 3x daily but not on insulin can check only 1x daily if need, bring log to next visit for review 4. Continue alpha lipoic acid with improved neuropathy - may resolve with controlled sugar, can avoid gabapentin at this time 5. Continue ARB - counseling today on reducing ASCVD risk in diabetic patient (10 yr risk calculated at about 3%, which is low) but given DM would benefit from Statin + ASA. He may consider ASA in future, and think  about statin discuss at next visit, likely low dose rosuvastatin even intermittent dosing, since his actual lipids are well controlled 6. DM Foot exam done today 7. Follow-up q 6 months - next annual physical + labs A1c, will be due for Pneumonia vaccine (pneumovax 23, 1st dose < age 61 for DM), TDap      Relevant Medications   Alpha-Lipoic Acid 100 MG CAPS   metFORMIN (GLUCOPHAGE) 1000 MG tablet   glucose blood (BAYER CONTOUR NEXT TEST) test strip   Other Relevant Orders   POCT HgB A1C (Completed)      Meds ordered this encounter  Medications  . Alpha-Lipoic Acid 100 MG CAPS    Sig: Take 1 capsule (100 mg total) by mouth daily.    Refill:  0  . metFORMIN (GLUCOPHAGE) 1000 MG tablet    Sig: Take 1 tablet (1,000 mg total) by mouth 2 (two) times daily with a meal.    Dispense:  180 tablet    Refill:  3  . glucose blood (BAYER CONTOUR NEXT TEST) test strip    Sig: Check blood sugar up to twice daily. Dx: E11.9    Dispense:  100 each    Refill:  12     Follow up plan: Return in about 6 months (around  05/12/2017) for Annual Physical.  [name], DO [facility] 11/11/2016, 12:36 AM

## 2016-11-11 ENCOUNTER — Encounter: Payer: Self-pay | Admitting: Family Medicine

## 2016-11-11 ENCOUNTER — Other Ambulatory Visit: Payer: Self-pay | Admitting: Family Medicine

## 2016-11-11 DIAGNOSIS — E1140 Type 2 diabetes mellitus with diabetic neuropathy, unspecified: Secondary | ICD-10-CM

## 2016-11-11 DIAGNOSIS — Z0000 Encounter for general adult medical examination without abnormal findings: Secondary | ICD-10-CM

## 2016-11-11 DIAGNOSIS — Z6841 Body Mass Index (BMI) 40.0 and over, adult: Secondary | ICD-10-CM

## 2016-11-11 DIAGNOSIS — I10 Essential (primary) hypertension: Secondary | ICD-10-CM

## 2016-11-11 DIAGNOSIS — Z8679 Personal history of other diseases of the circulatory system: Secondary | ICD-10-CM | POA: Insufficient documentation

## 2016-11-11 NOTE — Assessment & Plan Note (Signed)
Resolving, had chronic recurrence with poor healing, Left forearm Treated with Doxycycline, repeat rounds of antibiotics and topical hibiclens Followed by [facility] (last visit 03/2016)

## 2016-11-11 NOTE — Assessment & Plan Note (Addendum)
Stable without change. Suspected to be benign Followed by [facility]

## 2016-11-11 NOTE — Assessment & Plan Note (Signed)
Stable chronic abnormal weight with morbid obesity, had dramatic improvement with up to 30 lb wt loss in 6-9 months due to DM diet and lifestyle changes, now seems stagnant weight for past 3 months only change 1 lb - Complication with new DM2, genetic component of obesity  Plan: 1. Encouraged overall success so far with weight management - continue to treat underlying DM2, reassuring with lifestyle diet and goal to work on improving regular exercise, planned swimming 2. No additional med therapy needed at this time - consider future GLP1 if needed for DM control or switch for better wt loss 3. Not needed nutritionist, offered but he does not demonstrate need at this time 4. Follow-up q 6 months wt check - discussed option of future referral to [facility] Dr Leafy Ro as additional resource if still unsuccessful with further wt loss

## 2016-11-11 NOTE — Assessment & Plan Note (Signed)
Reported history. Has been stable. No concerns on exam. No murmur auscultated today

## 2016-11-11 NOTE — Assessment & Plan Note (Addendum)
Well-controlled DM with A1c 5.9 (stable from 6.1 but overall dramatic improvement from initial dx 9.2 in 09/6801) Complication with mild peripheral neuropathy - more localized to R foot but seems to be resolving.  Plan:  1. Continue current therapy - Metformin 1000mg  BID 2. Encourage improved lifestyle - low carb, low sugar diet, reduce portion size, continue improving emphasis on regular exercise 3. Continue to check CBG up to 3x daily but not on insulin can check only 1x daily if need, bring log to next visit for review 4. Continue alpha lipoic acid with improved neuropathy - may resolve with controlled sugar, can avoid gabapentin at this time 5. Continue ARB - counseling today on reducing ASCVD risk in diabetic patient (10 yr risk calculated at about 3%, which is low) but given DM would benefit from Statin + ASA. He may consider ASA in future, and think about statin discuss at next visit, likely low dose rosuvastatin even intermittent dosing, since his actual lipids are well controlled 6. DM Foot exam done today 7. Follow-up q 6 months - next annual physical + labs A1c, will be due for Pneumonia vaccine (pneumovax 23, 1st dose < age 35 for DM), TDap

## 2017-01-31 ENCOUNTER — Other Ambulatory Visit: Payer: Self-pay | Admitting: Family Medicine

## 2017-01-31 DIAGNOSIS — E1140 Type 2 diabetes mellitus with diabetic neuropathy, unspecified: Secondary | ICD-10-CM

## 2017-02-02 ENCOUNTER — Encounter: Payer: Self-pay | Admitting: Family Medicine

## 2017-02-02 DIAGNOSIS — E1140 Type 2 diabetes mellitus with diabetic neuropathy, unspecified: Secondary | ICD-10-CM

## 2017-02-03 MED ORDER — GLUCOSE BLOOD VI STRP: ORAL_STRIP | 3 refills | Status: DC

## 2017-04-27 DIAGNOSIS — D3131 Benign neoplasm of right choroid: Secondary | ICD-10-CM | POA: Insufficient documentation

## 2017-05-05 ENCOUNTER — Other Ambulatory Visit: Payer: Self-pay | Admitting: Family Medicine

## 2017-05-05 DIAGNOSIS — E1100 Type 2 diabetes mellitus with hyperosmolarity without nonketotic hyperglycemic-hyperosmolar coma (NKHHC): Secondary | ICD-10-CM

## 2017-05-05 MED ORDER — LOSARTAN POTASSIUM 25 MG PO TABS: 25.0000 mg | ORAL_TABLET | Freq: Every day | ORAL | 2 refills | Status: DC

## 2017-05-05 NOTE — Telephone Encounter (Signed)
Pt called requesting a refill on  Losartan  25 mg called into [facility]  In [place]

## 2017-05-10 ENCOUNTER — Other Ambulatory Visit: Payer: BLUE CROSS/BLUE SHIELD

## 2017-05-17 ENCOUNTER — Encounter: Payer: BLUE CROSS/BLUE SHIELD | Admitting: Family Medicine

## 2017-06-03 DIAGNOSIS — M179 Osteoarthritis of knee, unspecified: Secondary | ICD-10-CM | POA: Insufficient documentation

## 2017-06-09 ENCOUNTER — Other Ambulatory Visit: Payer: Self-pay

## 2017-06-14 LAB — HM DIABETES EYE EXAM

## 2017-06-20 ENCOUNTER — Other Ambulatory Visit: Payer: BLUE CROSS/BLUE SHIELD

## 2017-06-22 ENCOUNTER — Encounter: Payer: BLUE CROSS/BLUE SHIELD | Admitting: Family Medicine

## 2017-06-27 ENCOUNTER — Other Ambulatory Visit: Payer: Self-pay | Admitting: Family Medicine

## 2017-06-27 ENCOUNTER — Other Ambulatory Visit: Payer: Self-pay

## 2017-06-27 DIAGNOSIS — Z6841 Body Mass Index (BMI) 40.0 and over, adult: Secondary | ICD-10-CM

## 2017-06-27 DIAGNOSIS — Z0000 Encounter for general adult medical examination without abnormal findings: Secondary | ICD-10-CM

## 2017-06-27 DIAGNOSIS — E1140 Type 2 diabetes mellitus with diabetic neuropathy, unspecified: Secondary | ICD-10-CM

## 2017-06-27 DIAGNOSIS — I10 Essential (primary) hypertension: Secondary | ICD-10-CM

## 2017-06-28 ENCOUNTER — Other Ambulatory Visit: Payer: BLUE CROSS/BLUE SHIELD

## 2017-06-29 ENCOUNTER — Ambulatory Visit (INDEPENDENT_AMBULATORY_CARE_PROVIDER_SITE_OTHER): Payer: BLUE CROSS/BLUE SHIELD | Admitting: Family Medicine

## 2017-06-29 ENCOUNTER — Other Ambulatory Visit: Payer: Self-pay | Admitting: Family Medicine

## 2017-06-29 ENCOUNTER — Encounter: Payer: Self-pay | Admitting: Family Medicine

## 2017-06-29 VITALS — BP 125/78 | HR 74 | Temp 98.3°F | Resp 16 | Ht 73.0 in | Wt 332.5 lb

## 2017-06-29 DIAGNOSIS — E1140 Type 2 diabetes mellitus with diabetic neuropathy, unspecified: Secondary | ICD-10-CM

## 2017-06-29 DIAGNOSIS — Z6841 Body Mass Index (BMI) 40.0 and over, adult: Secondary | ICD-10-CM | POA: Diagnosis not present

## 2017-06-29 DIAGNOSIS — L739 Follicular disorder, unspecified: Secondary | ICD-10-CM | POA: Diagnosis not present

## 2017-06-29 DIAGNOSIS — Z0000 Encounter for general adult medical examination without abnormal findings: Secondary | ICD-10-CM | POA: Diagnosis not present

## 2017-06-29 DIAGNOSIS — I10 Essential (primary) hypertension: Secondary | ICD-10-CM

## 2017-06-29 LAB — COMPLETE METABOLIC PANEL WITH GFR
AG Ratio: 1.7 (calc) (ref 1.0–2.5)
ALBUMIN MSPROF: 3.9 g/dL (ref 3.6–5.1)
ALKALINE PHOSPHATASE (APISO): 62 U/L (ref 40–115)
ALT: 13 U/L (ref 9–46)
AST: 10 U/L (ref 10–40)
BUN: 12 mg/dL (ref 7–25)
CO2: 30 mmol/L (ref 20–32)
CREATININE: 0.75 mg/dL (ref 0.60–1.35)
Calcium: 9 mg/dL (ref 8.6–10.3)
Chloride: 105 mmol/L (ref 98–110)
GFR, EST AFRICAN AMERICAN: 125 mL/min/{1.73_m2} (ref >=60)
GFR, Est Non African American: 108 mL/min/{1.73_m2} (ref >=60)
GLOBULIN: 2.3 g/dL (ref 1.9–3.7)
GLUCOSE: 105 mg/dL — AB (ref 65–99)
Potassium: 4.1 mmol/L (ref 3.5–5.3)
SODIUM: 141 mmol/L (ref 135–146)
TOTAL PROTEIN: 6.2 g/dL (ref 6.1–8.1)
Total Bilirubin: 0.3 mg/dL (ref 0.2–1.2)

## 2017-06-29 LAB — LIPID PANEL
Cholesterol: 131 mg/dL (ref <200)
HDL: 42 mg/dL (ref >40)
LDL CHOLESTEROL (CALC): 72 mg/dL
Non-HDL Cholesterol (Calc): 89 mg/dL (calc) (ref <130)
TRIGLYCERIDES: 87 mg/dL (ref <150)
Total CHOL/HDL Ratio: 3.1 (calc) (ref <5.0)

## 2017-06-29 LAB — CBC WITH DIFFERENTIAL/PLATELET
BASOS PCT: 0.6 %
Basophils Absolute: 41 cells/uL (ref 0–200)
EOS ABS: 255 {cells}/uL (ref 15–500)
Eosinophils Relative: 3.7 %
HCT: 45 % (ref 38.5–50.0)
HEMOGLOBIN: 15.2 g/dL (ref 13.2–17.1)
Lymphs Abs: 1325 cells/uL (ref 850–3900)
MCH: 27.3 pg (ref 27.0–33.0)
MCHC: 33.8 g/dL (ref 32.0–36.0)
MCV: 80.9 fL (ref 80.0–100.0)
MPV: 10.8 fL (ref 7.5–12.5)
Monocytes Relative: 7.3 %
NEUTROS ABS: 4775 {cells}/uL (ref 1500–7800)
Neutrophils Relative %: 69.2 %
PLATELETS: 230 10*3/uL (ref 140–400)
RBC: 5.56 10*6/uL (ref 4.20–5.80)
RDW: 13.9 % (ref 11.0–15.0)
TOTAL LYMPHOCYTE: 19.2 %
WBC: 6.9 10*3/uL (ref 3.8–10.8)
WBCMIX: 504 {cells}/uL (ref 200–950)

## 2017-06-29 LAB — VITAMIN D 25 HYDROXY (VIT D DEFICIENCY, FRACTURES): Vit D, 25-Hydroxy: 15 ng/mL — ABNORMAL LOW (ref 30–100)

## 2017-06-29 LAB — HEMOGLOBIN A1C
HEMOGLOBIN A1C: 5.9 %{Hb} — AB (ref <5.7)
Mean Plasma Glucose: 123 (calc)
eAG (mmol/L): 6.8 (calc)

## 2017-06-29 MED ORDER — DOXYCYCLINE HYCLATE 100 MG PO TABS: 100.0000 mg | ORAL_TABLET | Freq: Two times a day (BID) | ORAL | 1 refills | Status: DC

## 2017-06-29 NOTE — Progress Notes (Signed)
Subjective:    Patient ID: Martin Sanders, male    DOB: 1971/01/06, 47 y.o.   MRN: [id number]  Martin Sanders is a 46 y.o. male presenting on 06/29/2017 for Annual Exam   HPI  Here for Annual Physical and Lab Results.  CHRONIC DM, Type 2 with Mild Peripheral Neuropathy // Morbid Obesity Today reports has tried to adhere to lifestyle well over holiday but did have some dietary indiscretions, but pleased with A1c unchanged 5.9 CBGs: Avg 100s (107 avg per meter), Low 77, (felt mild hypoglycemic symptoms felt weaker), High 167. Checks CBGs 2x daily - New glucometer, has Accu-chek per ins now instead of OneTouch (he thinks readings are lower now despite unchanged A1c) Meds: Metformin 1028m BID wc (without GI intolerance) - Never on other meds for DM Reports good compliance. Tolerating well w/o side-effects Currently on ARB Lifestyle: - Diet (Improving diet - but acknowledges problem over holidays with sweets, drinks mostly water, limited other zero calorie or no sugar drinks, he admits to eating more fruits sometimes bananas, that can cause inc sugar) - Exercise (Recent setback with R knee sprain after R knee arthroscopy surgery in December, now improved, and resumed walking and hiking with his wife, they have a camper/RV now and plan to do more, he has swimming pool with work on improving this for cardio exercise once warmer weather) - Weight gain now +10-12 lbs in 6-7 months, had been doing better now worse over holiday - Family history of obesity in family, no known diabetes in family, [name] - He checks both feet regularly as a diabetic, in past had mild reduced sensation plantar R foot under 1st MTP, since it has improved now recently few weeks seemed to be a little numb again - 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, [facility] he was seen in [facility], has recurrent lesion with itchy scratching changing to more purple scar and nodular vs folliculitis, he was given course of doxycycline for 1 month and hibiclens, if not improving then advised may need other approach for possible fungal etiology, he has not followed it as it improved on L forearm - Now recurrence within past few days to weeks with R forearm similar symptoms onset - Asking for refill Doxycyline  Health Maintenance: Due for Flu Shot, declines today despite counseling on benefits  Due for pneumonia vaccine - Pneumovax-23 before age [age]as diabetic, will reconsider for age 89 [facility] 06/[facility]/2017 06/29/2016 01/27/2016  Decreased Interest 0 0 0  Down, Depressed, Hopeless 0 0 0  PHQ - 2 Score 0 0 0    History reviewed. No pertinent past medical history. Past Surgical History:  Procedure Laterality Date  . KNEE ARTHROSCOPY AND ARTHROTOMY     Social History   Socioeconomic History  . Marital status: Married    Spouse name: Not on file  . Number of children: Not on file  . Years of education: Not on file  . Highest education level: Not on file  Social Needs  . Financial resource strain: Not on file  . Food insecurity - worry: Not on file  . Food insecurity - inability: Not on file  . Transportation needs - medical: Not on file  . Transportation  needs - non-medical: Not on file  Occupational History  . Not on file  Tobacco Use  . Smoking status: Never Smoker  . Smokeless tobacco: Never Used  Substance and Sexual Activity  . Alcohol use: No    Comment: occasionally 2 glasses wine   . Drug use: No  . Sexual activity: Not on file  Other Topics Concern  . Not on file  Social History Narrative  . Not on file   History reviewed. No pertinent family history. Current Outpatient Medications on File Prior to Visit  Medication Sig  . Alpha-Lipoic  Acid 100 MG CAPS Take 1 capsule (100 mg total) by mouth daily.  Marland Kitchen BAYER MICROLET LANCETS lancets Use with Bayer Contour meter to check blood sugar twice daily. Dx E11.9  . Blood Glucose Monitoring Suppl (CONTOUR NEXT EZ MONITOR) w/Device KIT 1 each by Does not apply route 2 (two) times daily. Dx E11.9  . cetirizine-pseudoephedrine (ZYRTEC-D) 5-120 MG tablet Take by mouth.  Marland Kitchen glucose blood (ONE TOUCH ULTRA TEST) test strip CHECK BLOOD SUGAR TWICE DAILY. DX: E11.9, 90 day supply  . losartan (COZAAR) 25 MG tablet Take 1 tablet (25 mg total) by mouth daily.  . metFORMIN (GLUCOPHAGE) 1000 MG tablet Take 1 tablet (1,000 mg total) by mouth 2 (two) times daily with a meal.  . sildenafil (REVATIO) 20 MG tablet Take 1-5 tablets as needed prior to sex.   No current facility-administered medications on file prior to visit.     Review of Systems  Constitutional: Positive for unexpected weight change (abnormal weight gain, despite same A1c). Negative for activity change, appetite change, chills, diaphoresis, fatigue and fever.  HENT: Negative for congestion, hearing loss and sinus pressure.   Eyes: Negative for visual disturbance.  Respiratory: Negative for apnea, cough, choking, chest tightness, shortness of breath and wheezing.   Cardiovascular: Negative for chest pain, palpitations and leg swelling.  Gastrointestinal: Negative for abdominal distention, abdominal pain, anal bleeding, blood in stool, constipation, diarrhea, nausea and vomiting.  Endocrine: Negative for cold intolerance and polyuria.  Genitourinary: Negative for decreased urine volume, difficulty urinating, dysuria, frequency, hematuria and urgency.  Musculoskeletal: Negative for arthralgias, back pain and neck pain.  Skin: Negative for rash.       R forearm some early mild folliculitis  Allergic/Immunologic: Negative for environmental allergies.  Neurological: Negative for dizziness, weakness, light-headedness, numbness and headaches.    Hematological: Negative for adenopathy.  Psychiatric/Behavioral: Negative for behavioral problems, dysphoric mood and sleep disturbance. The patient is not nervous/anxious.    Per HPI unless specifically indicated above     Objective:    BP 125/78   Pulse 74   Temp 98.3 F (36.8 C) (Oral)   Resp 16   Ht '6\' 1"'$  (1.854 m)   Wt (!) 332 lb 8 oz (150.8 kg)   BMI 43.87 kg/m   Wt Readings from Last 3 Encounters:  06/29/17 (!) 332 lb 8 oz (150.8 kg)  11/10/16 (!) 320 lb 9.6 oz (145.4 kg)  08/10/16 (!) 320 lb (145.2 kg)    Physical Exam  Constitutional: He is oriented to person, place, and time. He appears well-developed and well-nourished. No distress.  Well-appearing, comfortable, cooperative, obese  HENT:  Head: Normocephalic and atraumatic.  Mouth/Throat: Oropharynx is clear and moist.  Frontal / maxillary sinuses non-tender. Nares patent without purulence or edema. Bilateral TMs clear without erythema, effusion or bulging. Oropharynx clear without erythema, exudates, edema or asymmetry.  Eyes: Conjunctivae and EOM are normal. Pupils are equal,  round, and reactive to light. Right eye exhibits no discharge. Left eye exhibits no discharge.  Neck: Normal range of motion. Neck supple. No thyromegaly present.  Cardiovascular: Normal rate, regular rhythm, normal heart sounds and intact distal pulses.  No murmur heard. Pulmonary/Chest: Effort normal and breath sounds normal. No respiratory distress. He has no wheezes. He has no rales.  Abdominal: Soft. Bowel sounds are normal. He exhibits no distension and no mass. There is no tenderness.  Musculoskeletal: Normal range of motion. He exhibits no edema or tenderness.  Upper / Lower Extremities: - Normal muscle tone, strength bilateral upper extremities 5/5, lower extremities 5/5  Lymphadenopathy:    He has no cervical adenopathy.  Neurological: He is alert and oriented to person, place, and time.  Distal sensation intact to light touch  all extremities  R foot, plantar 1st MTP aspect intact normal sensation to light touch  Skin: Skin is warm and dry. Rash (R forearm inner aspect with 2-3 scattered slightly darker skin spots with palpable nodular with pustule consitent with early folliculitis, without extending erythema or active drainage) noted. He is not diaphoretic. No erythema.  Psychiatric: He has a normal mood and affect. His behavior is normal.  Well groomed, good eye contact, normal speech and thoughts  Nursing note and vitals reviewed.  Results for orders placed or performed in visit on 06/28/17  Lipid panel  Result Value Ref Range   Cholesterol 131 <200 mg/dL   HDL 42 >40 mg/dL   Triglycerides 87 <150 mg/dL   LDL Cholesterol (Calc) 72 mg/dL (calc)   Total CHOL/HDL Ratio 3.1 <5.0 (calc)   Non-HDL Cholesterol (Calc) 89 <130 mg/dL (calc)  CBC with Differential/Platelet  Result Value Ref Range   WBC 6.9 3.8 - 10.8 Thousand/uL   RBC 5.56 4.20 - 5.80 Million/uL   Hemoglobin 15.2 13.2 - 17.1 g/dL   HCT 45.0 38.5 - 50.0 %   MCV 80.9 80.0 - 100.0 fL   MCH 27.3 27.0 - 33.0 pg   MCHC 33.8 32.0 - 36.0 g/dL   RDW 13.9 11.0 - 15.0 %   Platelets 230 140 - 400 Thousand/uL   MPV 10.8 7.5 - 12.5 fL   Neutro Abs 4,775 1,500 - 7,800 cells/uL   Lymphs Abs 1,325 850 - 3,900 cells/uL   WBC mixed population 504 200 - 950 cells/uL   Eosinophils Absolute 255 15 - 500 cells/uL   Basophils Absolute 41 0 - 200 cells/uL   Neutrophils Relative % 69.2 %   Total Lymphocyte 19.2 %   Monocytes Relative 7.3 %   Eosinophils Relative 3.7 %   Basophils Relative 0.6 %  Hemoglobin A1c  Result Value Ref Range   Hgb A1c MFr Bld 5.9 (H) <5.7 % of total Hgb   Mean Plasma Glucose 123 (calc)   eAG (mmol/L) 6.8 (calc)  COMPLETE METABOLIC PANEL WITH GFR  Result Value Ref Range   Glucose, Bld 105 (H) 65 - 99 mg/dL   BUN 12 7 - 25 mg/dL   Creat 0.75 0.60 - 1.35 mg/dL   GFR, Est Non African American 108 > OR = 60 mL/min/1.51m   GFR, Est  African American 125 > OR = 60 mL/min/1.779m  BUN/Creatinine Ratio NOT APPLICABLE 6 - 22 (calc)   Sodium 141 135 - 146 mmol/L   Potassium 4.1 3.5 - 5.3 mmol/L   Chloride 105 98 - 110 mmol/L   CO2 30 20 - 32 mmol/L   Calcium 9.0 8.6 - 10.3 mg/dL  Total Protein 6.2 6.1 - 8.1 g/dL   Albumin 3.9 3.6 - 5.1 g/dL   Globulin 2.3 1.9 - 3.7 g/dL (calc)   AG Ratio 1.7 1.0 - 2.5 (calc)   Total Bilirubin 0.3 0.2 - 1.2 mg/dL   Alkaline phosphatase (APISO) 62 40 - 115 U/L   AST 10 10 - 40 U/L   ALT 13 9 - 46 U/L  VITAMIN D 25 Hydroxy (Vit-D Deficiency, Fractures)  Result Value Ref Range   Vit D, 25-Hydroxy 15 (L) 30 - 100 ng/mL      Assessment & Plan:   Problem List Items Addressed This Visit    Controlled type 2 diabetes mellitus with diabetic neuropathy ([place])    Well-controlled DM with A1c 5.9, unchanged from 5.9, still much improved from past Complication with mild peripheral neuropathy localized R foot only in past had similar symptoms ASCVD Risk as diabetic approx 3% - counseling in past on Statin - deferred today  Plan:  1. Continue current therapy - Metformin '1000mg'$  BID 2. Encourage improved lifestyle - low carb, low sugar diet, reduce portion size, continue improving emphasis on regular exercise - handout given on diet, has exercise plan in place with walking/hiking/swimming goal 3. Continue to check CBG up to 1-2x daily 4. Continue ARB - future consider ASA, Statin 5. Due next DM Eye at [facility] - 10/2017 6. Follow-up q 3 months - serum A1c trend, wt check, discussed GLP1 medications as indication with weight loss in diabetes if needed - would add to metformin reduce metf or maybe even taper off - Declines Pneumovax-23 - will reconsider at age 68      Essential hypertension    Well-controlled HTN - Home BP readings normal  No known complications     Plan:  1. Continue current BP regimen Losartan '25mg'$  daily 2. Encourage improved lifestyle - low sodium diet, regular exercise 3.  Continue monitor BP outside office, bring readings to next visit, if persistently >140/90 or new symptoms notify office sooner      Folliculitis    Recurrence, mild early R forearm Similar to Left Re-order 10 day supply doxycyline, with 1 refill, did not do 1 month course Last seen [facility] 03/2016 Dr Will Bonnet - advised patient to reach out to her if not improved, consider fungal etiology      Relevant Medications   doxycycline (VIBRA-TABS) 100 MG tablet   Morbid obesity with BMI of 40.0-44.9, adult (HCC)    Gradual weight gain >10-12 lbs in 7 months, despite some mixed lifestyle changes, now adhering more, controlled Q9V DM 5.9 - Complication with new DM2, genetic component of obesity  Plan: 1. Encouraged overall success so far with weight management - continue to treat underlying DM2, reassuring with lifestyle diet and goal to work on improving regular exercise, planned swimming / hiking / walking now, diet changes reviewed 2. No additional med therapy needed at this time - again discussed consider future GLP1 if needed for DM control or switch for better wt loss 3. Hold on nutrition referral for now 4. Follow-up q 3 months wt check - if needed consider GLP1 vs other option of future referral to [facility] Dr Leafy Ro as additional resource if still unsuccessful with further wt loss       Other Visit Diagnoses    Annual physical exam    -  Primary      Meds ordered this encounter  Medications  . doxycycline (VIBRA-TABS) 100 MG tablet  Sig: Take 1 tablet (100 mg total) by mouth 2 (two) times daily. For folliculitis flare    Dispense:  20 tablet    Refill:  1   Follow up plan: Return in about 3 months (around 09/27/2017) for Diabetes (review lab A1c) / Weight - med adjust.  Future order for lab A1c for 3 months.  [name], [facility] 06/29/2017, 3:07 PM

## 2017-06-29 NOTE — Assessment & Plan Note (Signed)
Well-controlled HTN ?- Home BP readings normal  ?No known complications  ?  ? ?Plan:  ?1. Continue current BP regimen Losartan 25mg daily ?2. Encourage improved lifestyle - low sodium diet, regular exercise ?3. Continue monitor BP outside office, bring readings to next visit, if persistently >140/90 or new symptoms notify office sooner ?

## 2017-06-29 NOTE — Assessment & Plan Note (Signed)
Recurrence, mild early R forearm Similar to Left Re-order 10 day supply doxycyline, with 1 refill, did not do 1 month course Last seen [facility] 03/2016 Dr Will Bonnet - advised patient to reach out to her if not improved, consider fungal etiology

## 2017-06-29 NOTE — Assessment & Plan Note (Signed)
Well-controlled DM with A1c 5.9, unchanged from 5.9, still much improved from past Complication with mild peripheral neuropathy localized R foot only in past had similar symptoms ASCVD Risk as diabetic approx 3% - counseling in past on Statin - deferred today  Plan:  1. Continue current therapy - Metformin 1000mg  BID 2. Encourage improved lifestyle - low carb, low sugar diet, reduce portion size, continue improving emphasis on regular exercise - handout given on diet, has exercise plan in place with walking/hiking/swimming goal 3. Continue to check CBG up to 1-2x daily 4. Continue ARB - future consider ASA, Statin 5. Due next DM Eye at [facility] - 10/2017 6. Follow-up q 3 months - serum A1c trend, wt check, discussed GLP1 medications as indication with weight loss in diabetes if needed - would add to metformin reduce metf or maybe even taper off - Declines Pneumovax-23 - will reconsider at age 47

## 2017-06-29 NOTE — Assessment & Plan Note (Addendum)
Gradual weight gain >10-12 lbs in 7 months, despite some mixed lifestyle changes, now adhering more, controlled W3U DM 5.9 - Complication with new DM2, genetic component of obesity  Plan: 1. Encouraged overall success so far with weight management - continue to treat underlying DM2, reassuring with lifestyle diet and goal to work on improving regular exercise, planned swimming / hiking / walking now, diet changes reviewed 2. No additional med therapy needed at this time - again discussed consider future GLP1 if needed for DM control or switch for better wt loss 3. Hold on nutrition referral for now 4. Follow-up q 3 months wt check - if needed consider GLP1 vs other option of future referral to [facility] Dr Leafy Ro as additional resource if still unsuccessful with further wt loss

## 2017-06-29 NOTE — Patient Instructions (Addendum)
Thank you for coming to the office today.  1.  Keep up the great work on your future lifestyle plan with diet and exercise changes  2.  Low Vitamin D  Start OTC Vitamin D3 5,000 iu daily for 12 weeks then reduce to OTC Vitamin D3 1,000 to 2,000 iu daily for maintenance  Recommend Pneumonia vaccine - Pneumovax-23 - one injection before age 47 - then at 84 you get a 2 series  If folliculitis worsens or other concerns - may check back in with Dr Baxter Kail [facility]) last seen 03/2016  3.  Call insurance find cost and coverage of the following - GLP1 Diabetic Injectable Medicines  Maybe use in conjunction with metformin lower dose or together  1. Ozempic (Semaglutide injection) - start 0.25mg  weekly for 4 weeks then increase to 0.5mg  weekly - This one has best benefit of weight loss and reducing Cardiovascular events  2. Bydureon BCise (Exenatide ER) - once weekly - this is my preference, very good medicine well tolerated, less side effects of nausea, upset stomach. No dose changes. Cost and coverage is the problem, but we may be able to get it with the coupon card  3. Trulicity (Dulaglutide) - once weekly - this is very good one, usually one of my top choices as well, two doses, 0.75 (likely we would start) and 1.5 max dose. We can use coupon card here too  4. Victoza (Liraglutide) - once DAILY - 3 dose changes 0.6, 1.2 and 1.8, side effects nausea, upset stomach higher on this one but it is still very effective medicine  DUE for FASTING BLOOD WORK (no food or drink after midnight before the lab appointment, only water or coffee without cream/sugar on the morning of)  SCHEDULE "Lab Only" visit in the morning at the clinic for lab draw in 3 MONTHS   - Make sure Lab Only appointment is at about 1 week before your next appointment, so that results will be available  For Lab Results, once available within 2-3 days of blood draw, you can can log in to MyChart online to view your  results and a brief explanation. Also, we can discuss results at next follow-up visit.   Please schedule a Follow-up Appointment to: Return in about 3 months (around 09/27/2017) for Diabetes (review lab A1c) / Weight - med adjust.    If you have any other questions or concerns, please feel free to call the office or send a message through [facility]. You may also schedule an earlier appointment if necessary.  Additionally, you may be receiving a survey about your experience at our office within a few days to 1 week by e-mail or mail. We value your feedback.  [name], DO [place]

## 2017-07-05 ENCOUNTER — Encounter: Payer: Self-pay | Admitting: Family Medicine

## 2017-07-05 ENCOUNTER — Telehealth: Payer: Self-pay

## 2017-07-05 DIAGNOSIS — E1100 Type 2 diabetes mellitus with hyperosmolarity without nonketotic hyperglycemic-hyperosmolar coma (NKHHC): Secondary | ICD-10-CM

## 2017-07-05 MED ORDER — LOSARTAN POTASSIUM 25 MG PO TABS: 25.0000 mg | ORAL_TABLET | Freq: Every day | ORAL | 2 refills | Status: DC

## 2017-07-05 NOTE — Telephone Encounter (Signed)
I have less than 10 Losartin pills left. My last refill was for only 30 pills for a 90 day supply? Can you resend that prescription again for a 90 day supply? Thank you!!!

## 2017-08-01 ENCOUNTER — Other Ambulatory Visit: Payer: Self-pay

## 2017-08-01 DIAGNOSIS — E1140 Type 2 diabetes mellitus with diabetic neuropathy, unspecified: Secondary | ICD-10-CM

## 2017-08-01 MED ORDER — METFORMIN HCL 1000 MG PO TABS: 1000.0000 mg | ORAL_TABLET | Freq: Two times a day (BID) | ORAL | 3 refills | Status: DC

## 2017-09-27 ENCOUNTER — Other Ambulatory Visit: Payer: BLUE CROSS/BLUE SHIELD

## 2017-09-29 ENCOUNTER — Ambulatory Visit: Payer: BLUE CROSS/BLUE SHIELD | Admitting: Family Medicine

## 2017-10-05 ENCOUNTER — Other Ambulatory Visit: Payer: BLUE CROSS/BLUE SHIELD

## 2017-10-05 DIAGNOSIS — E1140 Type 2 diabetes mellitus with diabetic neuropathy, unspecified: Secondary | ICD-10-CM

## 2017-10-06 ENCOUNTER — Ambulatory Visit (INDEPENDENT_AMBULATORY_CARE_PROVIDER_SITE_OTHER): Payer: BLUE CROSS/BLUE SHIELD | Admitting: Family Medicine

## 2017-10-06 ENCOUNTER — Encounter: Payer: Self-pay | Admitting: Family Medicine

## 2017-10-06 ENCOUNTER — Encounter (INDEPENDENT_AMBULATORY_CARE_PROVIDER_SITE_OTHER): Payer: Self-pay

## 2017-10-06 VITALS — BP 117/88 | HR 76 | Temp 98.0°F | Resp 16 | Ht 73.0 in | Wt 335.5 lb

## 2017-10-06 DIAGNOSIS — N529 Male erectile dysfunction, unspecified: Secondary | ICD-10-CM

## 2017-10-06 DIAGNOSIS — E1140 Type 2 diabetes mellitus with diabetic neuropathy, unspecified: Secondary | ICD-10-CM | POA: Diagnosis not present

## 2017-10-06 DIAGNOSIS — E559 Vitamin D deficiency, unspecified: Secondary | ICD-10-CM

## 2017-10-06 LAB — HEMOGLOBIN A1C
EAG (MMOL/L): 6.8 (calc)
Hgb A1c MFr Bld: 5.9 % of total Hgb — ABNORMAL HIGH (ref <5.7)
MEAN PLASMA GLUCOSE: 123 (calc)

## 2017-10-06 MED ORDER — SILDENAFIL CITRATE 20 MG PO TABS: 80.0000 mg | ORAL_TABLET | ORAL | 6 refills | Status: DC | PRN

## 2017-10-06 NOTE — Assessment & Plan Note (Signed)
Stable controlled on Sildenafil PRN Refilled Sildenafil take 4 for 80mg  PRN, #50, + refills, printed

## 2017-10-06 NOTE — Progress Notes (Signed)
Subjective:    Patient ID: Martin Sanders, male    DOB: Oct 08, 1970, 47 y.o.   MRN: [id number]  Martin Sanders is a 47 y.o. male presenting on 10/06/2017 for Diabetes (highest 146 and lowest 94)   HPI   FOLLOW-UP CHRONIC DM, Type 2with Mild Peripheral Neuropathy // Morbid Obesity Today he reports pleased with A1c 5.9 unchanged from last 2 readings. CBGs: Avg100s (has range 90 to 120), no hypoglycemia, High< 150. Checks CBGs2x daily Meds:Metformin 1000mg  BID wc (without GI intolerance) - Never on other meds for DM Reports good compliance. 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 He finished 3 month course of Vitamin D3 5,000 iu daily. Last labs done, did not include Vitamin D, it was unable to be added on after collection  Erectile Dysfunction: Requesting refill of Sildenafil. Typically takes 4 of the 20mg  tabs PRN with good results.   Depression screen [facility] 2/9 10/06/2017 06/29/2017 06/29/2016  Decreased Interest 0 0 0  Down, Depressed, Hopeless 0 0 0  PHQ - 2 Score 0 0 0    Social History   Tobacco Use  . Smoking status: Never Smoker  . Smokeless tobacco: Never Used  Substance Use Topics  . Alcohol use: No    Comment: occasionally 2 glasses wine   . Drug use: No    Review of Systems Per HPI unless specifically indicated above     Objective:    BP 117/88   Pulse  76   Temp 98 F (36.7 C) (Oral)   Resp 16   Ht 6\' 1"  (1.854 m)   Wt (!) 335 lb 8 oz (152.2 kg)   BMI 44.26 kg/m   Wt Readings from Last 3 Encounters:  10/06/17 (!) 335 lb 8 oz (152.2 kg)  06/29/17 (!) 332 lb 8 oz (150.8 kg)  11/10/16 (!) 320 lb 9.6 oz (145.4 kg)    Physical Exam  Constitutional: He is oriented to person, place, and time. He appears well-developed and well-nourished. No distress.  Well-appearing, comfortable, cooperative, obese  HENT:  Head: Normocephalic and atraumatic.  Mouth/Throat: Oropharynx is clear and moist.  Eyes: Conjunctivae are normal. Right eye exhibits no discharge. Left eye exhibits no discharge.  Neck: Normal range of motion. Neck supple. No thyromegaly present.  Cardiovascular: Normal rate, regular rhythm, normal heart sounds and intact distal pulses.  No murmur heard. Pulmonary/Chest: Effort normal and breath sounds normal. No respiratory distress. He has no wheezes. He has no rales.  Musculoskeletal: Normal range of motion. He exhibits no edema.  Lymphadenopathy:    He has no cervical adenopathy.  Neurological: He is alert and oriented to person, place, and time.  Skin: Skin is warm and dry. No rash noted. He is not diaphoretic. No erythema.  Psychiatric: He has a normal mood and affect. His behavior is  normal.  Well groomed, good eye contact, normal speech and thoughts  Nursing note and vitals reviewed.    Recent Labs    11/10/16 1003 06/28/17 0847 10/05/17 0811  HGBA1C 5.9* 5.9* 5.9*    Results for orders placed or performed in visit on 10/05/17  Hemoglobin A1c  Result Value Ref Range   Hgb A1c MFr Bld 5.9 (H) <5.7 % of total Hgb   Mean Plasma Glucose 123 (calc)   eAG (mmol/L) 6.8 (calc)      Assessment & Plan:   Problem List Items Addressed This Visit    Controlled type 2 diabetes mellitus with diabetic neuropathy ([place]) - Primary    Well-controlled DM with A1c 5.9, unchanged from 5.9 over past 12 months Complication with  mild peripheral neuropathy localized R foot only in past had similar symptoms ASCVD Risk as diabetic approx 3% - counseling in past on Statin - declined  Plan:  1. Continue current therapy - Metformin 1000mg  BID 2. Encourage improved lifestyle - low carb, low sugar diet, reduce portion size, continue improving emphasis on regular exercise - has exercise plan in place with walking/hiking/swimming goal 3. Continue to check CBG up to 1-2x daily 4. Continue ARB - future consider ASA, Statin 5. Due next DM Eye at [facility] - 10/2017 6. Follow-up q 5 months - serum A1c trend, wt check, future consider GLP1 - wt loss goal - then 06/2017 again for annual + labs      Relevant Orders   Hemoglobin A1c   ED (erectile dysfunction)    Stable controlled on Sildenafil PRN Refilled Sildenafil take 4 for 80mg  PRN, #50, + refills, printed      Relevant Medications   sildenafil (REVATIO) 20 MG tablet   Vitamin D deficiency    Previously low, now s/p 3 months of 5k daily Did not re-check with last lab, [facility] cannot add lab on due to collection tube Will plan to check Vit D again in 06/2018 at annual - for now he may take Vitamin D3 1,000 to 2,000 iu daily or 5k weekly         Meds ordered this encounter  Medications  . sildenafil (REVATIO) 20 MG tablet    Sig: Take 4 tablets (80 mg total) by mouth as needed (prior to sex).    Dispense:  50 tablet    Refill:  6    Follow up plan: Return in about 5 months (around 03/08/2018) for DM (lab result), Weight.  Future A1c lab ordered 03/07/18  Martin Sanders, [facility] 10/06/2017, 5:09 PM

## 2017-10-06 NOTE — Assessment & Plan Note (Signed)
Well-controlled DM with A1c 5.9, unchanged from 5.9 over past 12 months Complication with mild peripheral neuropathy localized R foot only in past had similar symptoms ASCVD Risk as diabetic approx 3% - counseling in past on Statin - declined  Plan:  1. Continue current therapy - Metformin 1000mg  BID 2. Encourage improved lifestyle - low carb, low sugar diet, reduce portion size, continue improving emphasis on regular exercise - has exercise plan in place with walking/hiking/swimming goal 3. Continue to check CBG up to 1-2x daily 4. Continue ARB - future consider ASA, Statin 5. Due next DM Eye at [facility] - 10/2017 6. Follow-up q 5 months - serum A1c trend, wt check, future consider GLP1 - wt loss goal - then 06/2017 again for annual + labs

## 2017-10-06 NOTE — Assessment & Plan Note (Signed)
Previously low, now s/p 3 months of 5k daily Did not re-check with last lab, [facility] cannot add lab on due to collection tube Will plan to check Vit D again in 06/2018 at annual - for now he may take Vitamin D3 1,000 to 2,000 iu daily or 5k weekly

## 2017-10-06 NOTE — Patient Instructions (Addendum)
Thank you for coming to the office today.  A1c 5.9, good result, same as last time over past 1 year, 5.9  Vitamin D was not checked I will call the lab to see if we can add it on - if not then we will plan to check again in future.  For maintenance dose - can take lower vitamin d - OTC Vitamin D3 1,000 to 2,000 iu daily for maintenance - or can take more sporadically if prefer. May even take 5000 every several days if you prefer.   Please schedule a Follow-up Appointment to: Return in about 5 months (around 03/08/2018) for DM (lab result), Weight.  If you have any other questions or concerns, please feel free to call the office or send a message through [place]. You may also schedule an earlier appointment if necessary.  Additionally, you may be receiving a survey about your experience at our office within a few days to 1 week by e-mail or mail. We value your feedback.  [name], DO [place]

## 2018-02-26 ENCOUNTER — Other Ambulatory Visit: Payer: Self-pay | Admitting: Family Medicine

## 2018-02-26 DIAGNOSIS — E1100 Type 2 diabetes mellitus with hyperosmolarity without nonketotic hyperglycemic-hyperosmolar coma (NKHHC): Secondary | ICD-10-CM

## 2018-02-28 ENCOUNTER — Telehealth: Payer: Self-pay | Admitting: Family Medicine

## 2018-02-28 NOTE — Telephone Encounter (Signed)
Pt needs refill on losartin 90 supply sent to [facility]

## 2018-03-07 ENCOUNTER — Other Ambulatory Visit: Payer: BLUE CROSS/BLUE SHIELD

## 2018-03-07 ENCOUNTER — Telehealth: Payer: Self-pay | Admitting: Family Medicine

## 2018-03-07 DIAGNOSIS — E559 Vitamin D deficiency, unspecified: Secondary | ICD-10-CM

## 2018-03-07 DIAGNOSIS — E1140 Type 2 diabetes mellitus with diabetic neuropathy, unspecified: Secondary | ICD-10-CM

## 2018-03-07 NOTE — Telephone Encounter (Signed)
Pt did labs today and order was supposed to include vit D but it didn't.  Please add to order

## 2018-03-07 NOTE — Telephone Encounter (Signed)
Incoming

## 2018-03-08 LAB — HEMOGLOBIN A1C
HEMOGLOBIN A1C: 6.2 %{Hb} — AB (ref <5.7)
Mean Plasma Glucose: 131 (calc)
eAG (mmol/L): 7.3 (calc)

## 2018-03-08 LAB — VITAMIN D 25 HYDROXY (VIT D DEFICIENCY, FRACTURES): Vit D, 25-Hydroxy: 29 ng/mL — ABNORMAL LOW (ref 30–100)

## 2018-03-14 ENCOUNTER — Ambulatory Visit (INDEPENDENT_AMBULATORY_CARE_PROVIDER_SITE_OTHER): Payer: BLUE CROSS/BLUE SHIELD | Admitting: Family Medicine

## 2018-03-14 ENCOUNTER — Encounter: Payer: Self-pay | Admitting: Family Medicine

## 2018-03-14 VITALS — BP 112/65 | HR 67 | Temp 98.3°F | Resp 16 | Ht 73.0 in | Wt 334.6 lb

## 2018-03-14 DIAGNOSIS — E559 Vitamin D deficiency, unspecified: Secondary | ICD-10-CM

## 2018-03-14 DIAGNOSIS — I10 Essential (primary) hypertension: Secondary | ICD-10-CM

## 2018-03-14 DIAGNOSIS — E1140 Type 2 diabetes mellitus with diabetic neuropathy, unspecified: Secondary | ICD-10-CM

## 2018-03-14 DIAGNOSIS — Z6841 Body Mass Index (BMI) 40.0 and over, adult: Secondary | ICD-10-CM

## 2018-03-14 NOTE — Progress Notes (Signed)
Subjective:    Patient ID: Martin Sanders, male    DOB: 18-May-1971, 47 y.o.   MRN: [id number]  ISAIAHS Sanders is a 47 y.o. male presenting on 03/14/2018 for Diabetes   HPI   FOLLOW-UP CHRONIC DM, Type 2with Mild Peripheral Neuropathy// Morbid Obesity Recent lab showed A1c up from 5.9 up to 6.2, he attributes this to increased carbs in diet potatoes bread. He states wife has encouraged him to help control diet now, he is not worried. CBGs: Avg100s (has range 90 to 120), Low 74 (hypoglycemia, rarely),High173. Checks CBGs2x daily Meds:Metformin 1000mg  BID wc (without GI intolerance) - Never on other meds for DM Reports good compliance. Tolerating well w/o side-effects Currently on ARB Lifestyle: - Diet (significant improvement in past few days - now controlled sugars, limited carbs aggressively now) - Exercise (staying active but not regular exercise, going on road trip to [place] and camper will do some walking and he plans to join gym when he returns.) - Weightdown 1 lb in few days, improving now - Admits some poor wound healing, using hibiclens with good results - Family history of obesity in family, no known diabetes in family, [place] heritage - Need last record from DM Eye will request - Admits some mild aching in ball of foot Denies any actual hypoglycemia, numbness tingling weakness, polyuria  CHRONIC HTN: Reports no new concerns, checks BP Current Meds - Losartan 25mg  daily   Reports good compliance, took meds today. Tolerating well, w/o complaints. Denies CP, dyspnea, HA, edema, dizziness / lightheadedness  Vitamin D Deficiency Last lab result Vitamin D 29 improved from prior 15. He took Vitamin D3 5,000 daily in past. Did not notice significant difference clinically with symptoms, he feels well. He got more sun time outside this summer. Now taking dose vitamin D3 intermittently, prefers once weekly on Saturday.  Erectile Dysfunction: Requesting refill of Sildenafil.  Typically takes 4 of the 20mg  tabs PRN with good results.   Health Maintenance: Due for Flu Shot, declines today despite counseling on benefits   Depression screen [facility] 2/9 03/14/2018 10/06/2017 06/29/2017  Decreased Interest 0 0 0  Down, Depressed, Hopeless 0 0 0  PHQ - 2 Score 0 0 0    Social History   Tobacco Use  . Smoking status: Never Smoker  . Smokeless tobacco: Never Used  Substance Use Topics  . Alcohol use: No    Comment: occasionally 2 glasses wine   . Drug use: No    Review of Systems Per HPI unless specifically indicated above     Objective:    BP 112/65   Pulse 67   Temp 98.3 F (36.8 C) (Oral)   Resp 16   Ht 6\' 1"  (1.854 m)   Wt (!) 334 lb 9.6 oz (151.8 kg)   BMI 44.15 kg/m   Wt Readings from Last 3 Encounters:  03/14/18 (!) 334 lb 9.6 oz (151.8 kg)  10/06/17 (!) 335 lb 8 oz (152.2 kg)  06/29/17 (!) 332 lb 8 oz (150.8 kg)    Physical Exam  Constitutional: He is oriented to person, place, and time. He appears well-developed and well-nourished. No distress.  Well-appearing, comfortable, cooperative, obese  HENT:  Head: Normocephalic and atraumatic.  Mouth/Throat: Oropharynx is clear and moist.  Eyes: Conjunctivae are normal. Right eye exhibits no discharge. Left eye exhibits no discharge.  Neck: Normal range of motion. Neck supple. No thyromegaly present.  Cardiovascular: Normal rate, regular rhythm, normal heart sounds and intact distal pulses.  No murmur heard. Pulmonary/Chest: Effort normal and breath sounds normal. No respiratory distress. He has no wheezes. He has no rales.  Musculoskeletal: Normal range of motion. He exhibits no edema.  Lymphadenopathy:    He has no cervical adenopathy.  Neurological: He is alert and oriented to person, place, and time.  Skin: Skin is warm and dry. No rash noted. He is not diaphoretic. No erythema.  Psychiatric: He has a normal mood and affect. His behavior is normal.  Well groomed, good eye contact, normal  speech and thoughts  Nursing note and vitals reviewed.     Diabetic Foot Exam - Simple   Simple Foot Form Diabetic Foot exam was performed with the following findings:  Yes 03/14/2018  9:49 AM  Visual Inspection No deformities, no ulcerations, no other skin breakdown bilaterally:  Yes Sensation Testing Intact to touch and monofilament testing bilaterally:  Yes Pulse Check Posterior Tibialis and Dorsalis pulse intact bilaterally:  Yes Comments    Recent Labs    06/28/17 0847 10/05/17 0811 03/07/18 0825  HGBA1C 5.9* 5.9* 6.2*    Results for orders placed or performed in visit on 03/07/18  VITAMIN D 25 Hydroxy (Vit-D Deficiency, Fractures)  Result Value Ref Range   Vit D, 25-Hydroxy 29 (L) 30 - 100 ng/mL      Assessment & Plan:   Problem List Items Addressed This Visit    Controlled type 2 diabetes mellitus with diabetic neuropathy ([place]) - Primary    Slightly elevated A1c from 5.9 to 6.2, but still well-controlled DM Without hyperglycemia or hypoglycemia based on readings Complication with mild peripheral neuropathy localized R foot only in past had similar symptoms ASCVD Risk as diabetic approx 3% - counseling in past on Statin - declined  Plan:  1. Continue current therapy - Metformin 1000mg  BID 2. Encourage improved lifestyle - low carb, low sugar diet, reduce portion size, continue improving emphasis on regular exercise 3. Continue to check CBG up to 1-2x daily 4. Continue ARB - future consider ASA, Statin 5. Request record from last DM eye exam 6. Follow-up q 4 months - future reconsider other meds      Essential hypertension    Well-controlled HTN - Home BP readings normal  No known complications     Plan:  1. Continue current BP regimen Losartan 25mg  daily 2. Encourage improved lifestyle - low sodium diet, regular exercise 3. Continue monitor BP outside office, bring readings to next visit, if persistently >140/90 or new symptoms notify office sooner        Morbid obesity with BMI of 40.0-44.9, adult ([place])    Still mild gradual wt loss, improving lifestyle diet/exercise now      Vitamin D deficiency    Improved s/p supplement Maintenance dosing now         No orders of the defined types were placed in this encounter.   Follow up plan: Return in about 4 months (around 07/15/2018) for Annual Physical.  Future labs ordered for 07/11/18  [name], [facility] 03/14/2018, 9:38 AM

## 2018-03-14 NOTE — Patient Instructions (Addendum)
Thank you for coming to the office today.  We will request eye record from the eye center [facility] - keep up with them, ask them to send [place] the copy from January 2020  A1c 6.2  As discussed keep up the good work now, sugars seem much improved.  Agree with plan to join gym when you return - sounds like a good way to keep improving lifestyle and wt loss  DUE for FASTING BLOOD WORK (no food or drink after midnight before the lab appointment, only water or coffee without cream/sugar on the morning of)  SCHEDULE "Lab Only" visit in the morning at the clinic for lab draw in 4 MONTHS   - Make sure Lab Only appointment is at about 1 week before your next appointment, so that results will be available  For Lab Results, once available within 2-3 days of blood draw, you can can log in to MyChart online to view your results and a brief explanation. Also, we can discuss results at next follow-up visit.  Colon Cancer Screening: - For all adults age 69+ routine colon cancer screening is highly recommended.     - Recent guidelines from [place] recommend starting age of 31 - Early detection of colon cancer is important, because often there are no warning signs or symptoms, also if found early usually it can be cured. Late stage is hard to treat.  - If you are not interested in Colonoscopy screening (if done and normal you could be cleared for 5 to 10 years until next due), then Cologuard is an excellent alternative for screening test for Colon Cancer. It is highly sensitive for detecting DNA of colon cancer from even the earliest stages. Also, there is NO bowel prep required. - If Cologuard is NEGATIVE, then it is good for 3 years before next due - If Cologuard is POSITIVE, then it is strongly advised to get a Colonoscopy, which allows the GI doctor to locate the source of the cancer or polyp (even very early stage) and treat it by removing it. -------------------------  If you would  like to proceed with Cologuard (stool DNA test) - FIRST, call your insurance company and tell them you want to check cost of Cologuard tell them CPT Code [phone] (it may be completely covered and you could get for no cost, OR max cost without any coverage is about $600). Also, keep in mind if you do NOT open the kit, and decide not to do the test, you will NOT be charged, you should contact the company if you decide not to do the test. - If you want to proceed, you can notify us (phone message, [place], or at next visit) and we will order it for you. The test kit will be delivered to you house within about 1 week. Follow instructions to collect sample, you may call the company for any help or questions, 24/7 telephone support at [phone].   Please schedule a Follow-up Appointment to: Return in about 4 months (around 07/15/2018) for Annual Physical.  If you have any other questions or concerns, please feel free to call the office or send a message through [place]. You may also schedule an earlier appointment if necessary.  Additionally, you may be receiving a survey about your experience at our office within a few days to 1 week by e-mail or mail. We value your feedback.  [name], DO [place]

## 2018-03-15 ENCOUNTER — Other Ambulatory Visit: Payer: Self-pay | Admitting: Family Medicine

## 2018-03-15 ENCOUNTER — Encounter: Payer: Self-pay | Admitting: Family Medicine

## 2018-03-15 DIAGNOSIS — Z0000 Encounter for general adult medical examination without abnormal findings: Secondary | ICD-10-CM

## 2018-03-15 DIAGNOSIS — I10 Essential (primary) hypertension: Secondary | ICD-10-CM

## 2018-03-15 DIAGNOSIS — E1140 Type 2 diabetes mellitus with diabetic neuropathy, unspecified: Secondary | ICD-10-CM

## 2018-03-15 DIAGNOSIS — Z6841 Body Mass Index (BMI) 40.0 and over, adult: Secondary | ICD-10-CM

## 2018-03-15 DIAGNOSIS — Z125 Encounter for screening for malignant neoplasm of prostate: Secondary | ICD-10-CM

## 2018-03-15 DIAGNOSIS — E559 Vitamin D deficiency, unspecified: Secondary | ICD-10-CM

## 2018-03-15 NOTE — Assessment & Plan Note (Signed)
Improved s/p supplement Maintenance dosing now

## 2018-03-15 NOTE — Assessment & Plan Note (Signed)
Slightly elevated A1c from 5.9 to 6.2, but still well-controlled DM Without hyperglycemia or hypoglycemia based on readings Complication with mild peripheral neuropathy localized R foot only in past had similar symptoms ASCVD Risk as diabetic approx 3% - counseling in past on Statin - declined  Plan:  1. Continue current therapy - Metformin 1000mg  BID 2. Encourage improved lifestyle - low carb, low sugar diet, reduce portion size, continue improving emphasis on regular exercise 3. Continue to check CBG up to 1-2x daily 4. Continue ARB - future consider ASA, Statin 5. Request record from last DM eye exam 6. Follow-up q 4 months - future reconsider other meds

## 2018-03-15 NOTE — Assessment & Plan Note (Signed)
Well-controlled HTN ?- Home BP readings normal  ?No known complications  ?  ? ?Plan:  ?1. Continue current BP regimen Losartan 25mg daily ?2. Encourage improved lifestyle - low sodium diet, regular exercise ?3. Continue monitor BP outside office, bring readings to next visit, if persistently >140/90 or new symptoms notify office sooner ?

## 2018-03-15 NOTE — Assessment & Plan Note (Signed)
Still mild gradual wt loss, improving lifestyle diet/exercise now

## 2018-03-30 ENCOUNTER — Encounter: Payer: Self-pay | Admitting: Family Medicine

## 2018-03-30 DIAGNOSIS — E1140 Type 2 diabetes mellitus with diabetic neuropathy, unspecified: Secondary | ICD-10-CM

## 2018-07-10 ENCOUNTER — Other Ambulatory Visit: Payer: Self-pay

## 2018-07-10 DIAGNOSIS — Z0000 Encounter for general adult medical examination without abnormal findings: Secondary | ICD-10-CM

## 2018-07-10 DIAGNOSIS — Z125 Encounter for screening for malignant neoplasm of prostate: Secondary | ICD-10-CM

## 2018-07-10 DIAGNOSIS — Z6841 Body Mass Index (BMI) 40.0 and over, adult: Secondary | ICD-10-CM

## 2018-07-10 DIAGNOSIS — I10 Essential (primary) hypertension: Secondary | ICD-10-CM

## 2018-07-10 DIAGNOSIS — E1140 Type 2 diabetes mellitus with diabetic neuropathy, unspecified: Secondary | ICD-10-CM

## 2018-07-10 DIAGNOSIS — E559 Vitamin D deficiency, unspecified: Secondary | ICD-10-CM

## 2018-07-11 ENCOUNTER — Other Ambulatory Visit: Payer: BLUE CROSS/BLUE SHIELD

## 2018-07-12 LAB — COMPLETE METABOLIC PANEL WITH GFR
AG Ratio: 1.6 (calc) (ref 1.0–2.5)
ALBUMIN MSPROF: 4.2 g/dL (ref 3.6–5.1)
ALT: 19 U/L (ref 9–46)
AST: 13 U/L (ref 10–35)
Alkaline phosphatase (APISO): 73 U/L (ref 35–144)
BUN: 16 mg/dL (ref 7–25)
CALCIUM: 9.7 mg/dL (ref 8.6–10.3)
CO2: 26 mmol/L (ref 20–32)
CREATININE: 0.8 mg/dL (ref 0.70–1.33)
Chloride: 102 mmol/L (ref 98–110)
GFR, EST NON AFRICAN AMERICAN: 104 mL/min/{1.73_m2} (ref >=60)
GFR, Est African American: 121 mL/min/{1.73_m2} (ref >=60)
GLUCOSE: 112 mg/dL — AB (ref 65–99)
Globulin: 2.6 g/dL (calc) (ref 1.9–3.7)
Potassium: 4.6 mmol/L (ref 3.5–5.3)
SODIUM: 139 mmol/L (ref 135–146)
TOTAL PROTEIN: 6.8 g/dL (ref 6.1–8.1)
Total Bilirubin: 0.5 mg/dL (ref 0.2–1.2)

## 2018-07-12 LAB — CBC WITH DIFFERENTIAL/PLATELET
ABSOLUTE MONOCYTES: 585 {cells}/uL (ref 200–950)
BASOS PCT: 0.8 %
Basophils Absolute: 63 cells/uL (ref 0–200)
Eosinophils Absolute: 237 cells/uL (ref 15–500)
Eosinophils Relative: 3 %
HEMATOCRIT: 49.3 % (ref 38.5–50.0)
Hemoglobin: 17.1 g/dL (ref 13.2–17.1)
LYMPHS ABS: 1351 {cells}/uL (ref 850–3900)
MCH: 28.4 pg (ref 27.0–33.0)
MCHC: 34.7 g/dL (ref 32.0–36.0)
MCV: 81.9 fL (ref 80.0–100.0)
MPV: 10.6 fL (ref 7.5–12.5)
Monocytes Relative: 7.4 %
NEUTROS ABS: 5664 {cells}/uL (ref 1500–7800)
NEUTROS PCT: 71.7 %
Platelets: 275 10*3/uL (ref 140–400)
RBC: 6.02 10*6/uL — AB (ref 4.20–5.80)
RDW: 13.9 % (ref 11.0–15.0)
Total Lymphocyte: 17.1 %
WBC: 7.9 10*3/uL (ref 3.8–10.8)

## 2018-07-12 LAB — LIPID PANEL
Cholesterol: 154 mg/dL (ref <200)
HDL: 42 mg/dL (ref >=40)
LDL Cholesterol (Calc): 85 mg/dL (calc)
Non-HDL Cholesterol (Calc): 112 mg/dL (calc) (ref <130)
Total CHOL/HDL Ratio: 3.7 (calc) (ref <5.0)
Triglycerides: 175 mg/dL — ABNORMAL HIGH (ref <150)

## 2018-07-12 LAB — VITAMIN D 25 HYDROXY (VIT D DEFICIENCY, FRACTURES): VIT D 25 HYDROXY: 29 ng/mL — AB (ref 30–100)

## 2018-07-12 LAB — HEMOGLOBIN A1C
HEMOGLOBIN A1C: 6.4 %{Hb} — AB (ref <5.7)
MEAN PLASMA GLUCOSE: 137 (calc)
eAG (mmol/L): 7.6 (calc)

## 2018-07-12 LAB — PSA: PSA: 0.5 ng/mL (ref <=4.0)

## 2018-07-12 LAB — HM DIABETES EYE EXAM

## 2018-07-18 ENCOUNTER — Ambulatory Visit (INDEPENDENT_AMBULATORY_CARE_PROVIDER_SITE_OTHER): Payer: BLUE CROSS/BLUE SHIELD | Admitting: Family Medicine

## 2018-07-18 ENCOUNTER — Other Ambulatory Visit: Payer: Self-pay

## 2018-07-18 ENCOUNTER — Encounter: Payer: Self-pay | Admitting: Family Medicine

## 2018-07-18 VITALS — BP 124/82 | HR 85 | Temp 98.6°F | Resp 16 | Ht 73.0 in | Wt 342.0 lb

## 2018-07-18 DIAGNOSIS — M79601 Pain in right arm: Secondary | ICD-10-CM

## 2018-07-18 DIAGNOSIS — E1140 Type 2 diabetes mellitus with diabetic neuropathy, unspecified: Secondary | ICD-10-CM | POA: Diagnosis not present

## 2018-07-18 DIAGNOSIS — Z23 Encounter for immunization: Secondary | ICD-10-CM | POA: Diagnosis not present

## 2018-07-18 DIAGNOSIS — Z6841 Body Mass Index (BMI) 40.0 and over, adult: Secondary | ICD-10-CM

## 2018-07-18 DIAGNOSIS — I10 Essential (primary) hypertension: Secondary | ICD-10-CM

## 2018-07-18 DIAGNOSIS — Z0000 Encounter for general adult medical examination without abnormal findings: Secondary | ICD-10-CM

## 2018-07-18 DIAGNOSIS — E559 Vitamin D deficiency, unspecified: Secondary | ICD-10-CM

## 2018-07-18 DIAGNOSIS — N529 Male erectile dysfunction, unspecified: Secondary | ICD-10-CM

## 2018-07-18 DIAGNOSIS — G8929 Other chronic pain: Secondary | ICD-10-CM

## 2018-07-18 MED ORDER — LOSARTAN POTASSIUM 25 MG PO TABS: 25.0000 mg | ORAL_TABLET | Freq: Every day | ORAL | 3 refills | Status: DC

## 2018-07-18 MED ORDER — SILDENAFIL CITRATE 20 MG PO TABS: 80.0000 mg | ORAL_TABLET | ORAL | 6 refills | Status: DC | PRN

## 2018-07-18 MED ORDER — METFORMIN HCL ER 500 MG PO TB24: 2000.0000 mg | ORAL_TABLET | Freq: Every day | ORAL | 1 refills | Status: DC

## 2018-07-18 NOTE — Assessment & Plan Note (Addendum)
Weight gain in past 4 months Improving diet, lifestyle, exercise now Switched metformin Future reconsider GLP1 agent for wt loss

## 2018-07-18 NOTE — Assessment & Plan Note (Signed)
Stable controlled on Sildenafil PRN Refilled Sildenafil take 4 for 80mg  PRN, #50, + refills, printed

## 2018-07-18 NOTE — Progress Notes (Signed)
Subjective:    Patient ID: Martin Sanders, male    DOB: 1971/02/27, 48 y.o.   MRN: [id number]  Martin Sanders is a 48 y.o. male presenting on 07/18/2018 for Annual Exam and Diabetes   HPI   Here for Annual Physical and Lab Review.  [place] DM, Type 2with Mild Peripheral Neuropathy// Morbid Obesity BMI >45 Last lab show A1c inc to 6.4 from 6.2 He attributes to change in metformin pill from pharmacy thinks it is different formulation, not working as well CBGs: Last 30 days - he has noticed - avg 100 to 130, high 170, low >100 Meds:Metformin 1067m BID wc (without GI intolerance) - Never on other meds for DM Reports good compliance. Tolerating well w/o side-effects Currently on ARB Lifestyle: Weight up since last visit - Diet (improved DM diet) - Exercise (active, goal to resume regular exercise) - Family history of obesity in family, no known diabetes in family, [name] - [facility] Need last record from DM Eye will request Denies any actual hypoglycemia  CHRONIC HTN: Reports no new concerns. He still checks BP outside office. Current Meds - Losartan 236mdaily   Reports good compliance, took meds today. Tolerating well, w/o complaints.  Vitamin D Deficiency Last lab result Vitamin D 29 still - he takes Vit D3 5k weekly  Erectile Dysfunction: Requesting refill of Sildenafil. Typically takes 4 of the 2056mabs PRN with good results.  Additional complaint  R forearm pain - he admits injury >1 year ago, strained his arm and has had issues with episodic pain in R forearm elbow region inner aspect of arm if lifting heavy and arm is back behind him or in odd position, can last for while episodes, improve with ibuprofen. Not tried compression wrap. No other injury or fall or redness or swelling.  Health Maintenance: Due for Flu Shot, will receive today   Colon CA Screening: Never had colonoscopy or prior colon  screening. Currently asymptomatic. No known family history of colon CA. Due for screening test / considering Cologuard, counseling given   [facility] 03/14/2018 10/06/2017  Decreased Interest 0 0 0  Down, Depressed, Hopeless 0 0 0  PHQ - 2 Score 0 0 0    History reviewed. No pertinent past medical history. Past Surgical History:  Procedure Laterality Date  . KNEE ARTHROSCOPY AND ARTHROTOMY     Social History   Socioeconomic History  . Marital status: Married    Spouse name: Not on file  . Number of children: Not on file  . Years of education: Not on file  . Highest education level: Not on file  Occupational History  . Not on file  Social Needs  . Financial resource strain: Not on file  . Food insecurity:    Worry: Not on file    Inability: Not on file  . Transportation needs:    Medical: Not on file    Non-medical: Not on file  Tobacco Use  . Smoking status: Never Smoker  . Smokeless tobacco: Never Used  Substance and Sexual Activity  . Alcohol use: No    Comment: occasionally 2 glasses wine   . Drug use: No  . Sexual activity: Not on file  Lifestyle  . Physical activity:    Days per week: Not on file    Minutes per session: Not on file  . Stress: Not on file  Relationships  . Social  connections:    Talks on phone: Not on file    Gets together: Not on file    Attends religious service: Not on file    Active member of club or organization: Not on file    Attends meetings of clubs or organizations: Not on file    Relationship status: Not on file  . Intimate partner violence:    Fear of current or ex partner: Not on file    Emotionally abused: Not on file    Physically abused: Not on file    Forced sexual activity: Not on file  Other Topics Concern  . Not on file  Social History Narrative  . Not on file   History reviewed. No pertinent family history. Current Outpatient Medications on File Prior to Visit  Medication Sig  .  Alpha-Lipoic Acid 100 MG CAPS Take 1 capsule (100 mg total) by mouth daily.  Marland Kitchen BAYER MICROLET LANCETS lancets Use with Bayer Contour meter to check blood sugar twice daily. Dx E11.9  . Blood Glucose Monitoring Suppl (CONTOUR NEXT EZ MONITOR) w/Device KIT 1 each by Does not apply route 2 (two) times daily. Dx E11.9  . Cholecalciferol (VITAMIN D3) 125 MCG (5000 UT) CAPS Take 5,000 Units by mouth once a week.  Marland Kitchen glucose blood (ONE TOUCH ULTRA TEST) test strip CHECK BLOOD SUGAR TWICE DAILY. DX: E11.9, 90 day supply  . cetirizine-pseudoephedrine (ZYRTEC-D) 5-120 MG tablet Take by mouth.   No current facility-administered medications on file prior to visit.     Review of Systems  Constitutional: Negative for activity change, appetite change, chills, diaphoresis, fatigue and fever.  HENT: Negative for congestion and hearing loss.   Eyes: Negative for visual disturbance.  Respiratory: Negative for cough, choking, chest tightness, shortness of breath and wheezing.   Cardiovascular: Negative for chest pain, palpitations and leg swelling.  Gastrointestinal: Negative for abdominal pain, anal bleeding, blood in stool, constipation, diarrhea, nausea and vomiting.  Endocrine: Negative for cold intolerance.  Genitourinary: Negative for decreased urine volume, difficulty urinating, dysuria, frequency, hematuria and urgency.  Musculoskeletal: Negative for arthralgias, back pain and neck pain.  Skin: Negative for rash.  Allergic/Immunologic: Negative for environmental allergies.  Neurological: Negative for dizziness, weakness, light-headedness, numbness and headaches.  Hematological: Negative for adenopathy.  Psychiatric/Behavioral: Negative for behavioral problems, dysphoric mood and sleep disturbance. The patient is not nervous/anxious.    Per HPI unless specifically indicated above      Objective:    BP 124/82   Pulse 85   Temp 98.6 F (37 C) (Oral)   Resp 16   Ht 6' 1"  (1.854 m)   Wt (!) 342  lb (155.1 kg)   BMI 45.12 kg/m   Wt Readings from Last 3 Encounters:  07/18/18 (!) 342 lb (155.1 kg)  03/14/18 (!) 334 lb 9.6 oz (151.8 kg)  10/06/17 (!) 335 lb 8 oz (152.2 kg)    Physical Exam Vitals signs and nursing note reviewed.  Constitutional:      General: He is not in acute distress.    Appearance: He is well-developed. He is not diaphoretic.     Comments: Well-appearing, comfortable, cooperative, obese  HENT:     Head: Normocephalic and atraumatic.     Comments: Frontal / maxillary sinuses non-tender. Nares patent without purulence or edema. Bilateral TMs clear without erythema, effusion or bulging. Oropharynx clear without erythema, exudates, edema or asymmetry. Eyes:     General:        Right eye: No discharge.  Left eye: No discharge.     Conjunctiva/sclera: Conjunctivae normal.     Pupils: Pupils are equal, round, and reactive to light.  Neck:     Musculoskeletal: Normal range of motion and neck supple.     Thyroid: No thyromegaly.  Cardiovascular:     Rate and Rhythm: Normal rate and regular rhythm.     Heart sounds: Normal heart sounds. No murmur.  Pulmonary:     Effort: Pulmonary effort is normal. No respiratory distress.     Breath sounds: Normal breath sounds. No wheezing or rales.  Abdominal:     General: Bowel sounds are normal. There is no distension.     Palpations: Abdomen is soft. There is no mass.     Tenderness: There is no abdominal tenderness.  Musculoskeletal: Normal range of motion.        General: No tenderness.     Comments: Upper / Lower Extremities: - Normal muscle tone, strength bilateral upper extremities 5/5, lower extremities 5/5  Right upper extremity no deformity, ecchymosis, edema, no bony tenderness elbow  Lymphadenopathy:     Cervical: No cervical adenopathy.  Skin:    General: Skin is warm and dry.     Findings: No erythema or rash.  Neurological:     Mental Status: He is alert and oriented to person, place, and time.       Comments: Distal sensation intact to light touch all extremities  Psychiatric:        Behavior: Behavior normal.     Comments: Well groomed, good eye contact, normal speech and thoughts    Recent Labs    10/05/17 0811 03/07/18 0825 07/11/18 0809  HGBA1C 5.9* 6.2* 6.4*    Results for orders placed or performed in visit on 07/10/18  VITAMIN D 25 Hydroxy (Vit-D Deficiency, Fractures)  Result Value Ref Range   Vit D, 25-Hydroxy 29 (L) 30 - 100 ng/mL  PSA  Result Value Ref Range   PSA 0.5 < OR = 4.0 ng/mL  Lipid panel  Result Value Ref Range   Cholesterol 154 <200 mg/dL   HDL 42 > OR = 40 mg/dL   Triglycerides 175 (H) <150 mg/dL   LDL Cholesterol (Calc) 85 mg/dL (calc)   Total CHOL/HDL Ratio 3.7 <5.0 (calc)   Non-HDL Cholesterol (Calc) 112 <130 mg/dL (calc)  COMPLETE METABOLIC PANEL WITH GFR  Result Value Ref Range   Glucose, Bld 112 (H) 65 - 99 mg/dL   BUN 16 7 - 25 mg/dL   Creat 0.80 0.70 - 1.33 mg/dL   GFR, Est Non African American 104 > OR = 60 mL/min/1.29m   GFR, Est African American 121 > OR = 60 mL/min/1.737m  BUN/Creatinine Ratio NOT APPLICABLE 6 - 22 (calc)   Sodium 139 135 - 146 mmol/L   Potassium 4.6 3.5 - 5.3 mmol/L   Chloride 102 98 - 110 mmol/L   CO2 26 20 - 32 mmol/L   Calcium 9.7 8.6 - 10.3 mg/dL   Total Protein 6.8 6.1 - 8.1 g/dL   Albumin 4.2 3.6 - 5.1 g/dL   Globulin 2.6 1.9 - 3.7 g/dL (calc)   AG Ratio 1.6 1.0 - 2.5 (calc)   Total Bilirubin 0.5 0.2 - 1.2 mg/dL   Alkaline phosphatase (APISO) 73 35 - 144 U/L   AST 13 10 - 35 U/L   ALT 19 9 - 46 U/L  CBC with Differential/Platelet  Result Value Ref Range   WBC 7.9 3.8 - 10.8 Thousand/uL  RBC 6.02 (H) 4.20 - 5.80 Million/uL   Hemoglobin 17.1 13.2 - 17.1 g/dL   HCT 49.3 38.5 - 50.0 %   MCV 81.9 80.0 - 100.0 fL   MCH 28.4 27.0 - 33.0 pg   MCHC 34.7 32.0 - 36.0 g/dL   RDW 13.9 11.0 - 15.0 %   Platelets 275 140 - 400 Thousand/uL   MPV 10.6 7.5 - 12.5 fL   Neutro Abs 5,664 1,500 - 7,800  cells/uL   Lymphs Abs 1,351 850 - 3,900 cells/uL   Absolute Monocytes 585 200 - 950 cells/uL   Eosinophils Absolute 237 15 - 500 cells/uL   Basophils Absolute 63 0 - 200 cells/uL   Neutrophils Relative % 71.7 %   Total Lymphocyte 17.1 %   Monocytes Relative 7.4 %   Eosinophils Relative 3.0 %   Basophils Relative 0.8 %  Hemoglobin A1c  Result Value Ref Range   Hgb A1c MFr Bld 6.4 (H) <5.7 % of total Hgb   Mean Plasma Glucose 137 (calc)   eAG (mmol/L) 7.6 (calc)       Assessment & Plan:   Problem List Items Addressed This Visit    Controlled type 2 diabetes mellitus with diabetic neuropathy ([place])    Still well controlled but again slightly elevated A1c from 6.2 up to 6.4 - remains at goal Without hyperglycemia or hypoglycemia based on readings Complication with mild peripheral neuropathy localized R foot only in past had similar symptoms ASCVD Risk as diabetic approx 3% - counseling in past on Statin - declined  Plan:  1. SWITCH from Metformin IR to Metformin ER 563m tabs x 4 per day - had ineffective batch of metformin IR by his report - he may check with pharmacy as well to see if manufacturer changed 2. Encourage improved lifestyle - low carb, low sugar diet, reduce portion size, continue improving emphasis on regular exercise 3. Continue to check CBG up to 1-2x daily 4. Continue ARB - future consider ASA, Statin 5. Request record from last DM eye exam - Dr RKerin Ransom 07/12/18 6. Follow-up q 6 months      Relevant Medications   metFORMIN (GLUCOPHAGE-XR) 500 MG 24 hr tablet   losartan (COZAAR) 25 MG tablet   ED (erectile dysfunction)    Stable controlled on Sildenafil PRN Refilled Sildenafil take 4 for 847mPRN, #50, + refills, printed      Relevant Medications   sildenafil (REVATIO) 20 MG tablet   Essential hypertension    Well-controlled HTN - Home BP readings normal  No known complications     Plan:  1. Continue current BP regimen Losartan 2521maily - refill 2.  Encourage improved lifestyle - low sodium diet, regular exercise 3. Continue monitor BP outside office, bring readings to next visit, if persistently >140/90 or new symptoms notify office sooner      Relevant Medications   sildenafil (REVATIO) 20 MG tablet   losartan (COZAAR) 25 MG tablet   Morbid obesity with BMI of 40.0-44.9, adult (HCC)    Weight gain in past 4 months Improving diet, lifestyle, exercise now Switched metformin Future reconsider GLP1 agent for wt loss      Relevant Medications   metFORMIN (GLUCOPHAGE-XR) 500 MG 24 hr tablet   Vitamin D deficiency    Stable Vit D 29, unchanged Continue Vit D3 5k weekly       Other Visit Diagnoses    Annual physical exam    -  Primary   Needs flu shot  Relevant Orders   Flu Vaccine QUAD 36+ mos IM (Completed)   Chronic pain of right upper extremity        For RUE pain, seems likely tendonitis or prior old muscle strain, with re-aggravation injury Likely only painful in compromising isolated position if arm is behind back and lifting heavier object Modify activity Try compression, RICE NSAID PRN Follow-up if persistent   Annual Phys Updated Health Maintenance information - Recommend Cologuard - advised him to check with wife, check insurance, and notify us when ready to proceed Reviewed recent lab results with patient Encouraged improvement to lifestyle with diet and exercise - Goal of weight loss    Meds ordered this encounter  Medications  . metFORMIN (GLUCOPHAGE-XR) 500 MG 24 hr tablet    Sig: Take 4 tablets (2,000 mg total) by mouth daily with breakfast.    Dispense:  360 tablet    Refill:  1    Change from Metformin IR  . sildenafil (REVATIO) 20 MG tablet    Sig: Take 4 tablets (80 mg total) by mouth as needed (prior to sex).    Dispense:  50 tablet    Refill:  6  . losartan (COZAAR) 25 MG tablet    Sig: Take 1 tablet (25 mg total) by mouth daily.    Dispense:  90 tablet    Refill:  3    Follow up  plan: Return in about 6 months (around 01/16/2019) for 6 month follow-up DM A1c, DM foot.  [name], DO [facility] 07/18/2018, 9:00 AM

## 2018-07-18 NOTE — Patient Instructions (Addendum)
Thank you for coming to the office today.  Recent Labs    10/05/17 0811 03/07/18 0825 07/11/18 0809  HGBA1C 5.9* 6.2* 6.4*    Switch regular Metformin over to the extended release ER - 529m tablets, can take up to 4 at once with meal, or can SPLIT to 2 and 2 - with meals.  ------------------------------------------  Refilled meds, printed sildenafil  Flu shot today - takes 2 weeks for full effect  --------  Colon Cancer Screening: - For all adults age 50+routine colon cancer screening is highly recommended.     - [place] 452- Early detection of colon cancer is important, because often there are no warning signs or symptoms, also if found early usually it can be cured. Late stage is hard to treat.  - If you are not interested in Colonoscopy screening (if done and normal you could be cleared for 5 to 10 years until next due), then Cologuard is an excellent alternative for screening test for Colon Cancer. It is highly sensitive for detecting DNA of colon cancer from even the earliest stages. Also, there is NO bowel prep required. - If Cologuard is NEGATIVE, then it is good for 3 years before next due - If Cologuard is POSITIVE, then it is strongly advised to get a Colonoscopy, which allows the GI doctor to locate the source of the cancer or polyp (even very early stage) and treat it by removing it. ------------------------- If you would like to proceed with Cologuard (stool DNA test) - FIRST, call your insurance company and tell them you want to check cost of Cologuard tell them CPT Code [id number](it may be completely covered and you could get for no cost, OR max cost without any coverage is about $600). Also, keep in mind if you do NOT open the kit, and decide not to do the test, you will NOT be charged, you should contact the company if you decide not to do the test. - If you want to proceed, you can notify [place]phone  [place], [place] or at next visit) and we will order it for you. The test kit will be delivered to you house within about 1 week. Follow instructions to collect sample, you may call the company for any help or questions, 24/7 telephone support at [phone]   Please schedule a Follow-up Appointment to: Return in about 6 months (around 01/16/2019) for 6 month follow-up DM A1c, DM foot.  If you have any other questions or concerns, please feel free to call the office or send a message through [place] You may also schedule an earlier appointment if necessary.  Additionally, you may be receiving a survey about your experience at our office within a few days to 1 week by e-mail or mail. We value your feedback.  ANobie Putnam DO [place]

## 2018-07-18 NOTE — Assessment & Plan Note (Signed)
Well-controlled HTN - Home BP readings normal  No known complications     Plan:  1. Continue current BP regimen Losartan 25mg  daily - refill 2. Encourage improved lifestyle - low sodium diet, regular exercise 3. Continue monitor BP outside office, bring readings to next visit, if persistently >140/90 or new symptoms notify office sooner

## 2018-07-18 NOTE — Assessment & Plan Note (Signed)
Stable Vit D 29, unchanged Continue Vit D3 5k weekly

## 2018-07-18 NOTE — Assessment & Plan Note (Signed)
Still well controlled but again slightly elevated A1c from 6.2 up to 6.4 - remains at goal Without hyperglycemia or hypoglycemia based on readings Complication with mild peripheral neuropathy localized R foot only in past had similar symptoms ASCVD Risk as diabetic approx 3% - counseling in past on Statin - declined  Plan:  1. SWITCH from Metformin IR to Metformin ER 500mg  tabs x 4 per day - had ineffective batch of metformin IR by his report - he may check with pharmacy as well to see if manufacturer changed 2. Encourage improved lifestyle - low carb, low sugar diet, reduce portion size, continue improving emphasis on regular exercise 3. Continue to check CBG up to 1-2x daily 4. Continue ARB - future consider ASA, Statin 5. Request record from last DM eye exam - Dr Kerin Ransom, 07/12/18 6. Follow-up q 6 months

## 2018-07-20 ENCOUNTER — Telehealth: Payer: Self-pay | Admitting: Family Medicine

## 2018-07-20 NOTE — Telephone Encounter (Signed)
Pt needs refills on lancets and strips.

## 2018-07-21 ENCOUNTER — Other Ambulatory Visit: Payer: Self-pay

## 2018-07-21 DIAGNOSIS — E1140 Type 2 diabetes mellitus with diabetic neuropathy, unspecified: Secondary | ICD-10-CM

## 2018-07-21 MED ORDER — BAYER MICROLET LANCETS MISC: 12 refills | Status: DC

## 2018-07-21 MED ORDER — GLUCOSE BLOOD VI STRP: ORAL_STRIP | 3 refills | Status: DC

## 2018-07-21 NOTE — Telephone Encounter (Signed)
Rx send

## 2018-07-24 DIAGNOSIS — E1140 Type 2 diabetes mellitus with diabetic neuropathy, unspecified: Secondary | ICD-10-CM

## 2018-07-24 MED ORDER — ACCU-CHEK GUIDE VI STRP: ORAL_STRIP | 3 refills | Status: DC

## 2018-07-24 MED ORDER — ACCU-CHEK FASTCLIX LANCETS MISC: 3 refills | Status: DC

## 2018-08-27 ENCOUNTER — Other Ambulatory Visit: Payer: Self-pay | Admitting: Family Medicine

## 2018-08-27 DIAGNOSIS — E1140 Type 2 diabetes mellitus with diabetic neuropathy, unspecified: Secondary | ICD-10-CM

## 2018-11-27 DIAGNOSIS — E1140 Type 2 diabetes mellitus with diabetic neuropathy, unspecified: Secondary | ICD-10-CM

## 2018-11-27 MED ORDER — METFORMIN HCL ER 500 MG PO TB24: 2000.0000 mg | ORAL_TABLET | Freq: Every day | ORAL | 1 refills | Status: DC

## 2019-01-16 ENCOUNTER — Ambulatory Visit: Payer: BLUE CROSS/BLUE SHIELD | Admitting: Family Medicine

## 2019-01-17 ENCOUNTER — Other Ambulatory Visit: Payer: Self-pay

## 2019-01-17 ENCOUNTER — Other Ambulatory Visit: Payer: Self-pay | Admitting: Family Medicine

## 2019-01-17 ENCOUNTER — Ambulatory Visit (INDEPENDENT_AMBULATORY_CARE_PROVIDER_SITE_OTHER): Payer: BLUE CROSS/BLUE SHIELD | Admitting: Family Medicine

## 2019-01-17 ENCOUNTER — Encounter: Payer: Self-pay | Admitting: Family Medicine

## 2019-01-17 VITALS — BP 120/78 | HR 90 | Temp 98.6°F | Resp 16 | Ht 73.0 in | Wt 340.0 lb

## 2019-01-17 DIAGNOSIS — Z6841 Body Mass Index (BMI) 40.0 and over, adult: Secondary | ICD-10-CM

## 2019-01-17 DIAGNOSIS — E1140 Type 2 diabetes mellitus with diabetic neuropathy, unspecified: Secondary | ICD-10-CM

## 2019-01-17 DIAGNOSIS — Z0000 Encounter for general adult medical examination without abnormal findings: Secondary | ICD-10-CM

## 2019-01-17 DIAGNOSIS — I10 Essential (primary) hypertension: Secondary | ICD-10-CM | POA: Diagnosis not present

## 2019-01-17 DIAGNOSIS — E785 Hyperlipidemia, unspecified: Secondary | ICD-10-CM | POA: Insufficient documentation

## 2019-01-17 DIAGNOSIS — E1169 Type 2 diabetes mellitus with other specified complication: Secondary | ICD-10-CM | POA: Insufficient documentation

## 2019-01-17 DIAGNOSIS — Z125 Encounter for screening for malignant neoplasm of prostate: Secondary | ICD-10-CM

## 2019-01-17 LAB — POCT GLYCOSYLATED HEMOGLOBIN (HGB A1C): Hemoglobin A1C: 6.4 % — AB (ref 4.0–5.6)

## 2019-01-17 NOTE — Patient Instructions (Addendum)
Thank you for coming to the office today.  Recent Labs    03/07/18 0825 07/11/18 0809 01/17/19 0904  HGBA1C 6.2* 6.4* 6.4*   Will request eye exam from Dr Kerin Ransom  Stress certainly could be raising blood sugar now. Keep up the good work overall to maintain.   Can do colon screening NEXT year  Colon Cancer Screening: - For all adults age 50+ routine colon cancer screening is highly recommended.     - Recent guidelines from [place] recommend starting age of 45 - Early detection of colon cancer is important, because often there are no warning signs or symptoms, also if found early usually it can be cured. Late stage is hard to treat.  - If you are not interested in Colonoscopy screening (if done and normal you could be cleared for 5 to 10 years until next due), then Cologuard is an excellent alternative for screening test for Colon Cancer. It is highly sensitive for detecting DNA of colon cancer from even the earliest stages. Also, there is NO bowel prep required. - If Cologuard is NEGATIVE, then it is good for 3 years before next due - If Cologuard is POSITIVE, then it is strongly advised to get a Colonoscopy, which allows the GI doctor to locate the source of the cancer or polyp (even very early stage) and treat it by removing it. -------------------------  Please schedule a Follow-up Appointment to: Return in about 6 months (around 07/20/2019) for Annual Physical.  If you have any other questions or concerns, please feel free to call the office or send a message through [place]. You may also schedule an earlier appointment if necessary.  Additionally, you may be receiving a survey about your experience at our office within a few days to 1 week by e-mail or mail. We value your feedback.  [name], DO [facility]

## 2019-01-17 NOTE — Addendum Note (Signed)
Addended by: Olin Hauser on: 01/17/2019 12:29 PM   Modules accepted: Orders

## 2019-01-17 NOTE — Assessment & Plan Note (Signed)
Well-controlled HTN ?- Home BP readings normal  ?No known complications  ?  ? ?Plan:  ?1. Continue current BP regimen Losartan 25mg daily ?2. Encourage improved lifestyle - low sodium diet, regular exercise ?3. Continue monitor BP outside office, bring readings to next visit, if persistently >140/90 or new symptoms notify office sooner ?

## 2019-01-17 NOTE — Progress Notes (Signed)
Subjective:    Patient ID: Martin Sanders, male    DOB: 05-09-1971, 48 y.o.   MRN: [id number]  Martin Sanders is a 48 y.o. male presenting on 01/17/2019 for Diabetes   HPI   FOLLOW-UPCHRONIC DM, Type 2with Mild Peripheral Neuropathy// Morbid Obesity BMI >44 Last lab show A1c inc to 6.4 from 6.2. Due today for A1c He states limited lifestyle but overall improving now. He is doing well on current dose of med. CBGs: Last 30 days - he has noticed - avg 90 - 120, high 205, low 68 (symptomatic - very rare) Meds:Metformin ER 1000mg  BID wc (500 x2 BID) (without GI intolerance) - Never on other meds for DM  Reports good compliance. Tolerating well w/o side-effects Currently on ARB Lifestyle: Weight down 2 lbs since last visit - Diet (improved DM diet) - Exercise (active, goal to resume regular exercise) - Family history of obesity in family, no known diabetes in family, [place] heritage -Last DM Eye Dr Phineas Douglas - [facility] - Need last record from DM Eye will request Denies polyuria, polydipsia, numbness tingling weakness  CHRONIC HTN: Reportsno new concerns. He still checks BP outside office. Current Meds -Losartan 25mg  daily Reports good compliance, took meds today. Tolerating well, w/o complaints. Denies CP, dyspnea, HA, edema, dizziness / lightheadedness   Health Maintenance: Consider Colonoscopy in 1 year when [place] pandemic is reduced.  Depression screen [facility] 2/9 01/17/2019 07/18/2018 03/14/2018  Decreased Interest 0 0 0  Down, Depressed, Hopeless 0 0 0  PHQ - 2 Score 0 0 0    Social History   Tobacco Use   Smoking status: Never Smoker   Smokeless tobacco: Never Used  Substance Use Topics   Alcohol use: No    Comment: occasionally 2 glasses wine    Drug use: No    Review of Systems Per HPI unless specifically indicated above     Objective:    BP 120/78 (BP Location: Left Arm, Cuff Size: Normal)    Pulse 90    Temp 98.6 F (37 C) (Oral)    Resp  16    Ht 6\' 1"  (1.854 m)    Wt (!) 340 lb (154.2 kg)    BMI 44.86 kg/m   Wt Readings from Last 3 Encounters:  01/17/19 (!) 340 lb (154.2 kg)  07/18/18 (!) 342 lb (155.1 kg)  03/14/18 (!) 334 lb 9.6 oz (151.8 kg)    Physical Exam Vitals signs and nursing note reviewed.  Constitutional:      General: He is not in acute distress.    Appearance: He is well-developed. He is not diaphoretic.     Comments: Well-appearing, comfortable, cooperative, obese  HENT:     Head: Normocephalic and atraumatic.  Eyes:     General:        Right eye: No discharge.        Left eye: No discharge.     Conjunctiva/sclera: Conjunctivae normal.  Neck:     Musculoskeletal: Normal range of motion and neck supple.     Thyroid: No thyromegaly.  Cardiovascular:     Rate and Rhythm: Normal rate and regular rhythm.     Heart sounds: Normal heart sounds. No murmur.  Pulmonary:     Effort: Pulmonary effort is normal. No respiratory distress.     Breath sounds: Normal breath sounds. No wheezing or rales.  Musculoskeletal: Normal range of motion.  Lymphadenopathy:     Cervical: No cervical adenopathy.  Skin:  General: Skin is warm and dry.     Findings: No erythema or rash.  Neurological:     Mental Status: He is alert and oriented to person, place, and time.  Psychiatric:        Behavior: Behavior normal.     Comments: Well groomed, good eye contact, normal speech and thoughts      Diabetic Foot Exam - Simple   Simple Foot Form Diabetic Foot exam was performed with the following findings: Yes 01/17/2019  9:06 AM  Visual Inspection See comments: Yes Sensation Testing See comments: Yes Pulse Check Posterior Tibialis and Dorsalis pulse intact bilaterally: Yes Comments Bilateral feet with callus formation on heels. Left worse than Right mild reduced sensation to monofilament. Left Great toenail with lateral ingrown area that is healing and now improved growing out.    Recent Labs    03/07/18 0825  07/11/18 0809 01/17/19 0904  HGBA1C 6.2* 6.4* 6.4*      Results for orders placed or performed in visit on 01/17/19  POCT HgB A1C  Result Value Ref Range   Hemoglobin A1C 6.4 (A) 4.0 - 5.6 %      Assessment & Plan:   Problem List Items Addressed This Visit    Controlled type 2 diabetes mellitus with diabetic neuropathy ([place]) - Primary    Well controlled DM, A1c 6.4 stable, some inc stress / lifestyle change Rare hypoglycemia and hyperglycemia. Complication with mild peripheral neuropathy localized R foot only in past had similar symptoms ASCVD Risk as diabetic approx 3% - counseling in past on Statin - declined  Plan:  1. CONTINUE Metformin ER 1000mg  BID (500 x 2 BID) 2. Encourage improved lifestyle - low carb, low sugar diet, reduce portion size, continue improving emphasis on regular exercise 3. Continue to check CBG up to 1-2x daily 4. Continue ARB - future consider ASA, Statin 5. Request record from last DM eye exam - Dr Kerin Ransom, 2020 - DM Foot exam today 6. Follow-up q 6 months annual      Relevant Orders   POCT HgB A1C (Completed)   Essential hypertension    Well-controlled HTN - Home BP readings normal  No known complications     Plan:  1. Continue current BP regimen Losartan 25mg  daily 2. Encourage improved lifestyle - low sodium diet, regular exercise 3. Continue monitor BP outside office, bring readings to next visit, if persistently >140/90 or new symptoms notify office sooner      Morbid obesity with BMI of 40.0-44.9, adult ([place])    Weight down 2 lbs Improve diet exercise Follow-up         No orders of the defined types were placed in this encounter.     Follow up plan: Return in about 6 months (around 07/20/2019) for Annual Physical.  Future labs ordered for 07/2019  [name], [facility] 01/17/2019, 9:09 AM

## 2019-01-17 NOTE — Assessment & Plan Note (Signed)
Weight down 2 lbs Improve diet exercise Follow-up

## 2019-01-17 NOTE — Assessment & Plan Note (Signed)
Well controlled DM, A1c 6.4 stable, some inc stress / lifestyle change Rare hypoglycemia and hyperglycemia. Complication with mild peripheral neuropathy localized R foot only in past had similar symptoms ASCVD Risk as diabetic approx 3% - counseling in past on Statin - declined  Plan:  1. CONTINUE Metformin ER 1000mg  BID (500 x 2 BID) 2. Encourage improved lifestyle - low carb, low sugar diet, reduce portion size, continue improving emphasis on regular exercise 3. Continue to check CBG up to 1-2x daily 4. Continue ARB - future consider ASA, Statin 5. Request record from last DM eye exam - Dr Kerin Ransom, 2020 - DM Foot exam today 6. Follow-up q 6 months annual

## 2019-06-04 ENCOUNTER — Other Ambulatory Visit: Payer: Self-pay

## 2019-06-04 DIAGNOSIS — E1140 Type 2 diabetes mellitus with diabetic neuropathy, unspecified: Secondary | ICD-10-CM

## 2019-06-04 MED ORDER — METFORMIN HCL ER 500 MG PO TB24: 1000.0000 mg | ORAL_TABLET | Freq: Two times a day (BID) | ORAL | 3 refills | Status: DC

## 2019-06-21 ENCOUNTER — Other Ambulatory Visit: Payer: Self-pay | Admitting: Family Medicine

## 2019-06-21 DIAGNOSIS — E1140 Type 2 diabetes mellitus with diabetic neuropathy, unspecified: Secondary | ICD-10-CM

## 2019-07-17 ENCOUNTER — Other Ambulatory Visit: Payer: Self-pay

## 2019-07-17 ENCOUNTER — Other Ambulatory Visit: Payer: BLUE CROSS/BLUE SHIELD

## 2019-07-17 DIAGNOSIS — Z125 Encounter for screening for malignant neoplasm of prostate: Secondary | ICD-10-CM

## 2019-07-17 DIAGNOSIS — E785 Hyperlipidemia, unspecified: Secondary | ICD-10-CM

## 2019-07-17 DIAGNOSIS — E1140 Type 2 diabetes mellitus with diabetic neuropathy, unspecified: Secondary | ICD-10-CM

## 2019-07-17 DIAGNOSIS — I10 Essential (primary) hypertension: Secondary | ICD-10-CM

## 2019-07-17 DIAGNOSIS — E1169 Type 2 diabetes mellitus with other specified complication: Secondary | ICD-10-CM

## 2019-07-17 DIAGNOSIS — Z0000 Encounter for general adult medical examination without abnormal findings: Secondary | ICD-10-CM

## 2019-07-18 LAB — CBC WITH DIFFERENTIAL/PLATELET
Absolute Monocytes: 555 cells/uL (ref 200–950)
Basophils Absolute: 51 cells/uL (ref 0–200)
Basophils Relative: 0.7 %
Eosinophils Absolute: 292 cells/uL (ref 15–500)
Eosinophils Relative: 4 %
HCT: 49.2 % (ref 38.5–50.0)
Hemoglobin: 16.6 g/dL (ref 13.2–17.1)
Lymphs Abs: 1365 cells/uL (ref 850–3900)
MCH: 27.6 pg (ref 27.0–33.0)
MCHC: 33.7 g/dL (ref 32.0–36.0)
MCV: 81.7 fL (ref 80.0–100.0)
MPV: 10.3 fL (ref 7.5–12.5)
Monocytes Relative: 7.6 %
Neutro Abs: 5037 cells/uL (ref 1500–7800)
Neutrophils Relative %: 69 %
Platelets: 266 10*3/uL (ref 140–400)
RBC: 6.02 10*6/uL — ABNORMAL HIGH (ref 4.20–5.80)
RDW: 14 % (ref 11.0–15.0)
Total Lymphocyte: 18.7 %
WBC: 7.3 10*3/uL (ref 3.8–10.8)

## 2019-07-18 LAB — COMPLETE METABOLIC PANEL WITH GFR
AG Ratio: 1.5 (calc) (ref 1.0–2.5)
ALT: 18 U/L (ref 9–46)
AST: 12 U/L (ref 10–35)
Albumin: 4 g/dL (ref 3.6–5.1)
Alkaline phosphatase (APISO): 78 U/L (ref 35–144)
BUN: 10 mg/dL (ref 7–25)
CO2: 27 mmol/L (ref 20–32)
Calcium: 9.3 mg/dL (ref 8.6–10.3)
Chloride: 104 mmol/L (ref 98–110)
Creat: 0.7 mg/dL (ref 0.70–1.33)
GFR, Est African American: 127 mL/min/{1.73_m2} (ref >=60)
GFR, Est Non African American: 109 mL/min/{1.73_m2} (ref >=60)
Globulin: 2.7 g/dL (calc) (ref 1.9–3.7)
Glucose, Bld: 145 mg/dL — ABNORMAL HIGH (ref 65–99)
Potassium: 4.6 mmol/L (ref 3.5–5.3)
Sodium: 139 mmol/L (ref 135–146)
Total Bilirubin: 0.4 mg/dL (ref 0.2–1.2)
Total Protein: 6.7 g/dL (ref 6.1–8.1)

## 2019-07-18 LAB — HEMOGLOBIN A1C
Hgb A1c MFr Bld: 7 % of total Hgb — ABNORMAL HIGH (ref <5.7)
Mean Plasma Glucose: 154 (calc)
eAG (mmol/L): 8.5 (calc)

## 2019-07-18 LAB — LIPID PANEL
Cholesterol: 153 mg/dL (ref <200)
HDL: 43 mg/dL (ref >=40)
LDL Cholesterol (Calc): 83 mg/dL (calc)
Non-HDL Cholesterol (Calc): 110 mg/dL (calc) (ref <130)
Total CHOL/HDL Ratio: 3.6 (calc) (ref <5.0)
Triglycerides: 169 mg/dL — ABNORMAL HIGH (ref <150)

## 2019-07-18 LAB — PSA: PSA: 0.5 ng/mL (ref <=4.0)

## 2019-07-24 ENCOUNTER — Encounter: Payer: Self-pay | Admitting: Family Medicine

## 2019-07-24 ENCOUNTER — Other Ambulatory Visit: Payer: Self-pay

## 2019-07-24 ENCOUNTER — Ambulatory Visit (INDEPENDENT_AMBULATORY_CARE_PROVIDER_SITE_OTHER): Payer: BLUE CROSS/BLUE SHIELD | Admitting: Family Medicine

## 2019-07-24 VITALS — BP 119/88 | HR 99 | Temp 98.0°F | Resp 16 | Ht 73.0 in | Wt 362.5 lb

## 2019-07-24 DIAGNOSIS — E1169 Type 2 diabetes mellitus with other specified complication: Secondary | ICD-10-CM

## 2019-07-24 DIAGNOSIS — Z6841 Body Mass Index (BMI) 40.0 and over, adult: Secondary | ICD-10-CM

## 2019-07-24 DIAGNOSIS — N529 Male erectile dysfunction, unspecified: Secondary | ICD-10-CM

## 2019-07-24 DIAGNOSIS — Z0000 Encounter for general adult medical examination without abnormal findings: Secondary | ICD-10-CM

## 2019-07-24 DIAGNOSIS — E1140 Type 2 diabetes mellitus with diabetic neuropathy, unspecified: Secondary | ICD-10-CM | POA: Diagnosis not present

## 2019-07-24 DIAGNOSIS — E785 Hyperlipidemia, unspecified: Secondary | ICD-10-CM

## 2019-07-24 DIAGNOSIS — Z23 Encounter for immunization: Secondary | ICD-10-CM

## 2019-07-24 DIAGNOSIS — I10 Essential (primary) hypertension: Secondary | ICD-10-CM

## 2019-07-24 MED ORDER — SILDENAFIL CITRATE 20 MG PO TABS: 80.0000 mg | ORAL_TABLET | ORAL | 6 refills | Status: DC | PRN

## 2019-07-24 NOTE — Assessment & Plan Note (Signed)
Controlled cholesterol not on statin Last lipid panel 07/2019  Plan: 1. Future may warrant statin therapy / ASA for ASCVD risk reduction 2. Encourage improved lifestyle - low carb/cholesterol, reduce portion size, continue improving regular exercise

## 2019-07-24 NOTE — Assessment & Plan Note (Signed)
Well-controlled HTN ?- Home BP readings normal  ?No known complications  ?  ? ?Plan:  ?1. Continue current BP regimen Losartan 25mg daily ?2. Encourage improved lifestyle - low sodium diet, regular exercise ?3. Continue monitor BP outside office, bring readings to next visit, if persistently >140/90 or new symptoms notify office sooner ?

## 2019-07-24 NOTE — Assessment & Plan Note (Signed)
Weight up 20 lbs approx in 6 months Abnormal lifestyle diet non adherence as much but goal to control Follow-up, offered GLP1

## 2019-07-24 NOTE — Progress Notes (Signed)
Subjective:    Patient ID: Martin Sanders, male    DOB: [birth year], 49 y.o.   MRN: [id number]  Martin Sanders is a 49 y.o. male presenting on 07/24/2019 for Annual Exam   HPI   Here for Annual Physical and Lab Review.  FOLLOW-UPCHRONIC DM, Type 2with Mild Peripheral Neuropathy// Morbid ObesityBMI >47 Last lab show A1c inc to 7.0 from prior 6.4 was steadily controlled under 7 in past He admits lifestyle diet has not been adherent and some weight gain and has goal to correct this now with lifestyle change CBGs:Last 30 days - he has noticed - avg 90 - 120, high >160+ x 7 results in past 30 days, low >100 Meds:Metformin ER 1046m BID wc (500 x2 BID) (without GI intolerance) - Never on other meds for DM  Reports good compliance. Tolerating well w/o side-effects Currently on ARB Lifestyle: Weight increased 15-20 lbs in 6 months approx - Diet (recently not as adherent to diabetes diet) - Exercise (active, goal to resume regular exercise) - Family history of obesity in family, no known diabetes in family, Martin Sanders -Last DM Eye Dr KPhineas Sanders- 07/2018 - [facility] will request Denies polyuria, polydipsia, numbness tingling weakness  CHRONIC HTN: Reportsno new concerns. He still checks BP outside office. Current Meds -Losartan 261mdaily Reports good compliance, took meds today. Tolerating well, w/o complaints. Denies CP, dyspnea, HA, edema, dizziness / lightheadedness  Erectile Dysfunction Controlled on Sildenafil PRN, refill order today.  Health Maintenance:  Consider initial Colonoscopy in 1 year when [place]. He declines now, offered cologuard also wants to hold off on that test.  PSA 0.5, negative   [facility] 01/17/2019 07/18/2018 03/14/2018  Decreased Interest 0 0 0  Down, Depressed, Hopeless 0 0 0  PHQ - 2 Score 0 0 0    History reviewed. No pertinent past medical  history. Past Surgical History:  Procedure Laterality Date  . KNEE ARTHROSCOPY AND ARTHROTOMY     Social History   Socioeconomic History  . Marital status: Married    Spouse name: Not on file  . Number of children: Not on file  . Years of education: Not on file  . Highest education level: Not on file  Occupational History  . Not on file  Tobacco Use  . Smoking status: Never Smoker  . Smokeless tobacco: Never Used  Substance and Sexual Activity  . Alcohol use: No    Comment: occasionally 2 glasses wine   . Drug use: No  . Sexual activity: Not on file  Other Topics Concern  . Not on file  Social History Narrative  . Not on file   Social Determinants of Health   Financial Resource Strain:   . Difficulty of Paying Living Expenses: Not on file  Food Insecurity:   . Worried About RuCharity Sanders the Last Year: Not on file  . Ran Out of Food in the Last Year: Not on file  Transportation Needs:   . Lack of Transportation (Medical): Not on file  . Lack of Transportation (Non-Medical): Not on file  Physical Activity:   . Days of Exercise per Week: Not on file  . Minutes of Exercise per Session: Not on file  Stress:   . Feeling of Stress : Not on file  Social Connections:   . Frequency of Communication with Friends and Family: Not on file  .  Frequency of Social Gatherings with Friends and Family: Not on file  . Attends Religious Services: Not on file  . Active Member of Clubs or Organizations: Not on file  . Attends Archivist Meetings: Not on file  . Marital Status: Not on file  Intimate Partner Violence:   . Fear of Current or Ex-Partner: Not on file  . Emotionally Abused: Not on file  . Physically Abused: Not on file  . Sexually Abused: Not on file   History reviewed. No pertinent family history. Current Outpatient Medications on File Prior to Visit  Medication Sig  . ACCU-CHEK FASTCLIX LANCETS MISC Check sugar up to 2 x daily  . ACCU-CHEK GUIDE  test strip USE TO CHECK BLOOD SUGAR UP TO 2 X DAILY  . Alpha-Lipoic Acid 100 MG CAPS Take 1 capsule (100 mg total) by mouth daily.  . Blood Glucose Monitoring Suppl (CONTOUR NEXT EZ MONITOR) w/Device KIT 1 each by Does not apply route 2 (two) times daily. Dx E11.9  . cetirizine-pseudoephedrine (ZYRTEC-D) 5-120 MG tablet Take by mouth.  . Cholecalciferol (VITAMIN D3) 125 MCG (5000 UT) CAPS Take 5,000 Units by mouth once a week.  . losartan (COZAAR) 25 MG tablet Take 1 tablet (25 mg total) by mouth daily.  . metFORMIN (GLUCOPHAGE-XR) 500 MG 24 hr tablet Take 2 tablets (1,000 mg total) by mouth 2 (two) times daily.   No current facility-administered medications on file prior to visit.    Review of Systems  Constitutional: Negative for activity change, appetite change, chills, diaphoresis, fatigue and fever.  HENT: Negative for congestion and hearing loss.   Eyes: Negative for visual disturbance.  Respiratory: Negative for apnea, cough, chest tightness, shortness of breath and wheezing.   Cardiovascular: Negative for chest pain, palpitations and leg swelling.  Gastrointestinal: Negative for abdominal pain, constipation, diarrhea, nausea and vomiting.  Endocrine: Negative for cold intolerance.  Genitourinary: Negative for decreased urine volume, difficulty urinating, dysuria, frequency and hematuria.  Musculoskeletal: Negative for arthralgias and neck pain.  Skin: Negative for rash.  Allergic/Immunologic: Negative for environmental allergies.  Neurological: Negative for dizziness, weakness, light-headedness, numbness and headaches.  Hematological: Negative for adenopathy.  Psychiatric/Behavioral: Negative for behavioral problems, dysphoric mood and sleep disturbance. The patient is not nervous/anxious.    Per HPI unless specifically indicated above      Objective:    BP 119/88   Pulse 99   Temp 98 F (36.7 C) (Oral)   Resp 16   Ht 6' 1"  (1.854 m)   Wt (!) 362 lb 8 oz (164.4 kg)    BMI 47.83 kg/m   Wt Readings from Last 3 Encounters:  07/24/19 (!) 362 lb 8 oz (164.4 kg)  01/17/19 (!) 340 lb (154.2 kg)  07/18/18 (!) 342 lb (155.1 kg)    Physical Exam Vitals and nursing note reviewed.  Constitutional:      General: He is not in acute distress.    Appearance: He is well-developed. He is not diaphoretic.     Comments: Well-appearing, comfortable, cooperative, obese  HENT:     Head: Normocephalic and atraumatic.  Eyes:     General:        Right eye: No discharge.        Left eye: No discharge.     Conjunctiva/sclera: Conjunctivae normal.     Pupils: Pupils are equal, round, and reactive to light.  Neck:     Thyroid: No thyromegaly.     Vascular: No carotid bruit.  Cardiovascular:  Rate and Rhythm: Normal rate and regular rhythm.     Heart sounds: Normal heart sounds. No murmur.  Pulmonary:     Effort: Pulmonary effort is normal. No respiratory distress.     Breath sounds: Normal breath sounds. No wheezing or rales.  Abdominal:     General: Bowel sounds are normal. There is no distension.     Palpations: Abdomen is soft. There is no mass.     Tenderness: There is no abdominal tenderness.  Musculoskeletal:        General: No tenderness. Normal range of motion.     Cervical back: Normal range of motion and neck supple.     Comments: Upper / Lower Extremities: - Normal muscle tone, strength bilateral upper extremities 5/5, lower extremities 5/5  Lymphadenopathy:     Cervical: No cervical adenopathy.  Skin:    General: Skin is warm and dry.     Findings: No erythema or rash.  Neurological:     Mental Status: He is alert and oriented to person, place, and time.     Comments: Distal sensation intact to light touch all extremities  Psychiatric:        Behavior: Behavior normal.     Comments: Well groomed, good eye contact, normal speech and thoughts    Results for orders placed or performed in visit on 07/17/19  PSA  Result Value Ref Range   PSA 0.5  < OR = 4.0 ng/mL  Lipid panel  Result Value Ref Range   Cholesterol 153 <200 mg/dL   HDL 43 > OR = 40 mg/dL   Triglycerides 169 (H) <150 mg/dL   LDL Cholesterol (Calc) 83 mg/dL (calc)   Total CHOL/HDL Ratio 3.6 <5.0 (calc)   Non-HDL Cholesterol (Calc) 110 <130 mg/dL (calc)  COMPLETE METABOLIC PANEL WITH GFR  Result Value Ref Range   Glucose, Bld 145 (H) 65 - 99 mg/dL   BUN 10 7 - 25 mg/dL   Creat 0.70 0.70 - 1.33 mg/dL   GFR, Est Non African American 109 > OR = 60 mL/min/1.37m   GFR, Est African American 127 > OR = 60 mL/min/1.780m  BUN/Creatinine Ratio NOT APPLICABLE 6 - 22 (calc)   Sodium 139 135 - 146 mmol/L   Potassium 4.6 3.5 - 5.3 mmol/L   Chloride 104 98 - 110 mmol/L   CO2 27 20 - 32 mmol/L   Calcium 9.3 8.6 - 10.3 mg/dL   Total Protein 6.7 6.1 - 8.1 g/dL   Albumin 4.0 3.6 - 5.1 g/dL   Globulin 2.7 1.9 - 3.7 g/dL (calc)   AG Ratio 1.5 1.0 - 2.5 (calc)   Total Bilirubin 0.4 0.2 - 1.2 mg/dL   Alkaline phosphatase (APISO) 78 35 - 144 U/L   AST 12 10 - 35 U/L   ALT 18 9 - 46 U/L  CBC with Differential/Platelet  Result Value Ref Range   WBC 7.3 3.8 - 10.8 Thousand/uL   RBC 6.02 (H) 4.20 - 5.80 Million/uL   Hemoglobin 16.6 13.2 - 17.1 g/dL   HCT 49.2 38.5 - 50.0 %   MCV 81.7 80.0 - 100.0 fL   MCH 27.6 27.0 - 33.0 pg   MCHC 33.7 32.0 - 36.0 g/dL   RDW 14.0 11.0 - 15.0 %   Platelets 266 140 - 400 Thousand/uL   MPV 10.3 7.5 - 12.5 fL   Neutro Abs 5,037 1,500 - 7,800 cells/uL   Lymphs Abs 1,365 850 - 3,900 cells/uL   Absolute Monocytes 555  200 - 950 cells/uL   Eosinophils Absolute 292 15 - 500 cells/uL   Basophils Absolute 51 0 - 200 cells/uL   Neutrophils Relative % 69 %   Total Lymphocyte 18.7 %   Monocytes Relative 7.6 %   Eosinophils Relative 4.0 %   Basophils Relative 0.7 %  Hemoglobin A1c  Result Value Ref Range   Hgb A1c MFr Bld 7.0 (H) <5.7 % of total Hgb   Mean Plasma Glucose 154 (calc)   eAG (mmol/L) 8.5 (calc)      Assessment & Plan:   Problem  List Items Addressed This Visit    Morbid obesity with BMI of 45.0-49.9, adult (HCC)    Weight up 20 lbs approx in 6 months Abnormal lifestyle diet non adherence as much but goal to control Follow-up, offered GLP1      Hyperlipidemia associated with type 2 diabetes mellitus (H[facility])    Controlled cholesterol not on statin Last lipid panel 07/2019  Plan: 1. Future may warrant statin therapy / ASA for ASCVD risk reduction 2. Encourage improved lifestyle - low carb/cholesterol, reduce portion size, continue improving regular exercise      Essential hypertension    Well-controlled HTN - Home BP readings normal  No known complications     Plan:  1. Continue current BP regimen Losartan 4m daily 2. Encourage improved lifestyle - low sodium diet, regular exercise 3. Continue monitor BP outside office, bring readings to next visit, if persistently >140/90 or new symptoms notify office sooner      Relevant Medications   sildenafil (REVATIO) 20 MG tablet   ED (erectile dysfunction)    Stable controlled on Sildenafil PRN Refilled Sildenafil take 4 for 863mPRN, #50, + refills, printed      Relevant Medications   sildenafil (REVATIO) 20 MG tablet   Controlled type 2 diabetes mellitus with diabetic neuropathy (HCC)    Controlled DM, A1c up to 7 now but still controlled, prior 6.1 to 6.4 - worse with some lifestyle non adherence Rare hypoglycemia and inc hyperglycemia >1>758t times Complication with mild peripheral neuropathy localized R foot only in past had similar symptoms ASCVD Risk as diabetic approx 3% - counseling in past on Statin - declined  Plan:  1. CONTINUE Metformin ER 100065mID (500 x 2 BID) 2. Encourage improved lifestyle - low carb, low sugar diet, reduce portion size, continue improving emphasis on regular exercise 3. Continue to check CBG up to 1-2x daily 4. Continue ARB - future consider ASA, Statin 5. F/u as scheduled DM Eye Exam Dr RitKerin Ransom Follow-up 3 months DM  A1c  Discussion on GLP1 agents today as add on med, new start. Ultimately we agree to defer new med for 3 more months, emphasis on goal of wt loss appetite suppression, and cardio protective benefits and also some A1c control but he is near goal already, could adjust dose metformin. [place].       Other Visit Diagnoses    Annual physical exam    -  Primary   Needs flu shot       Relevant Orders   Flu Vaccine QUAD 36+ mos IM (Completed)      Updated Health Maintenance information Reviewed recent lab results with patient Encouraged improvement to lifestyle with diet and exercise - Goal of weight loss   Meds ordered this encounter  Medications  . sildenafil (REVATIO) 20 MG tablet    Sig: Take 4 tablets (80  mg total) by mouth as needed (prior to sex).    Dispense:  50 tablet    Refill:  6      Follow up plan: Return in about 3 months (around 10/21/2019) for 3 month DM A1c ?GLP1.  [name], DO [facility] 07/24/2019, 9:22 AM

## 2019-07-24 NOTE — Assessment & Plan Note (Signed)
Controlled DM, A1c up to 7 now but still controlled, prior 6.1 to 6.4 - worse with some lifestyle non adherence Rare hypoglycemia and inc hyperglycemia [id number] at times Complication with mild peripheral neuropathy localized R foot only in past had similar symptoms ASCVD Risk as diabetic approx 3% - counseling in past on Statin - declined  Plan:  1. CONTINUE Metformin ER 1000mg  BID (500 x 2 BID) 2. Encourage improved lifestyle - low carb, low sugar diet, reduce portion size, continue improving emphasis on regular exercise 3. Continue to check CBG up to 1-2x daily 4. Continue ARB - future consider ASA, Statin 5. F/u as scheduled DM Eye Exam Dr Kerin Ransom 6. Follow-up 3 months DM A1c  Discussion on GLP1 agents today as add on med, new start. Ultimately we agree to defer new med for 3 more months, emphasis on goal of wt loss appetite suppression, and cardio protective benefits and also some A1c control but he is near goal already, could adjust dose metformin. He is interested in [place], will look into this option.

## 2019-07-24 NOTE — Assessment & Plan Note (Signed)
Stable controlled on Sildenafil PRN Refilled Sildenafil take 4 for 80mg  PRN, #50, + refills, printed

## 2019-07-24 NOTE — Patient Instructions (Addendum)
Thank you for coming to the office today.  Think about newer Diabetes medicines  1. Ozempic (Semaglutide injection) - start 0.25mg  weekly for 4 weeks then increase to 0.5mg  weekly - This one has best benefit of weight loss and reducing Cardiovascular events  - Rybelsus pill is same as ozempic but not quite as strong.   2. Bydureon BCise (Exenatide ER) - once weekly - this is my preference, very good medicine well tolerated, less side effects of nausea, upset stomach. No dose changes. Cost and coverage is the problem, but we may be able to get it with the coupon card  3. Trulicity (Dulaglutide) - once weekly - this is very good one, usually one of my top choices as well, two doses, 0.75 (likely we would start) and 1.5 max dose. We can use coupon card here too  4. Victoza (Liraglutide) - once DAILY - 3 dose changes 0.6, 1.2 and 1.8, side effects nausea, upset stomach higher on this one but it is still very effective medicine  Future Colonoscopy - let me know when ready  Flu Shot  Recent Labs    01/17/19 0904 07/17/19 0800  HGBA1C 6.4* 7.0*     Please schedule a Follow-up Appointment to: Return in about 3 months (around 10/21/2019) for 3 month DM A1c ?GLP1.  If you have any other questions or concerns, please feel free to call the office or send a message through [place]. You may also schedule an earlier appointment if necessary.  Additionally, you may be receiving a survey about your experience at our office within a few days to 1 week by e-mail or mail. We value your feedback.  [name], DO [place]

## 2019-08-31 ENCOUNTER — Other Ambulatory Visit: Payer: Self-pay | Admitting: Family Medicine

## 2019-08-31 DIAGNOSIS — E1140 Type 2 diabetes mellitus with diabetic neuropathy, unspecified: Secondary | ICD-10-CM

## 2019-08-31 DIAGNOSIS — I10 Essential (primary) hypertension: Secondary | ICD-10-CM

## 2019-09-17 DIAGNOSIS — E1140 Type 2 diabetes mellitus with diabetic neuropathy, unspecified: Secondary | ICD-10-CM

## 2019-09-17 MED ORDER — ACCU-CHEK FASTCLIX LANCETS MISC: 3 refills | Status: AC

## 2019-09-17 MED ORDER — ACCU-CHEK GUIDE VI STRP: ORAL_STRIP | 3 refills | Status: DC

## 2019-10-24 ENCOUNTER — Encounter: Payer: Self-pay | Admitting: Family Medicine

## 2019-10-24 ENCOUNTER — Ambulatory Visit (INDEPENDENT_AMBULATORY_CARE_PROVIDER_SITE_OTHER): Payer: BC Managed Care – PPO | Admitting: Family Medicine

## 2019-10-24 ENCOUNTER — Other Ambulatory Visit: Payer: Self-pay

## 2019-10-24 VITALS — BP 140/93 | HR 87 | Temp 98.6°F | Resp 16 | Ht 73.0 in | Wt 350.0 lb

## 2019-10-24 DIAGNOSIS — E1140 Type 2 diabetes mellitus with diabetic neuropathy, unspecified: Secondary | ICD-10-CM | POA: Diagnosis not present

## 2019-10-24 DIAGNOSIS — L739 Follicular disorder, unspecified: Secondary | ICD-10-CM | POA: Diagnosis not present

## 2019-10-24 LAB — POCT GLYCOSYLATED HEMOGLOBIN (HGB A1C): Hemoglobin A1C: 6.9 % — AB (ref 4.0–5.6)

## 2019-10-24 MED ORDER — DOXYCYCLINE HYCLATE 100 MG PO TABS: 100.0000 mg | ORAL_TABLET | Freq: Two times a day (BID) | ORAL | 0 refills | Status: DC

## 2019-10-24 MED ORDER — OZEMPIC (0.25 OR 0.5 MG/DOSE) 2 MG/1.5ML ~~LOC~~ SOPN: 0.2500 mg | PEN_INJECTOR | SUBCUTANEOUS | 0 refills | Status: DC

## 2019-10-24 MED ORDER — MUPIROCIN 2 % EX OINT: 1.0000 "application " | TOPICAL_OINTMENT | Freq: Two times a day (BID) | CUTANEOUS | 2 refills | Status: DC

## 2019-10-24 NOTE — Assessment & Plan Note (Signed)
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

## 2019-10-24 NOTE — Patient Instructions (Addendum)
Thank you for coming to the office today.  Call insurance find cost and coverage of the following  1. Ozempic (Semaglutide injection) - start 0.25mg  weekly for 4 weeks then increase to 0.5mg  weekly - 6 weeks on 1 pen free sample - This one has best benefit of weight loss and reducing Cardiovascular events  We will call you when the Ozempic sample is back in stock. And you can pick it up anytime.  Message me in 3-5 weeks when ready to have to place a new order for your own rx to pharmacy. If use copay discount card can do an 84 day or 3 month supply for same price.   Please schedule a Follow-up Appointment to: Return in about 3 months (around 01/24/2020) for 3 month DM A1c new med adjust.  If you have any other questions or concerns, please feel free to call the office or send a message through [place]. You may also schedule an earlier appointment if necessary.  Additionally, you may be receiving a survey about your experience at our office within a few days to 1 week by e-mail or mail. We value your feedback.  [name], DO [place]

## 2019-10-24 NOTE — Progress Notes (Signed)
Subjective:    Patient ID: Martin Sanders, male    DOB: 06-23-1970, 49 y.o.   MRN: [id number]  Martin Sanders is a 49 y.o. male presenting on 10/24/2019 for Diabetes   HPI   FOLLOW-UPCHRONIC DM, Type 2with Mild Peripheral Neuropathy// Morbid Obesity Interval update A1c down to 6.9 today increasing activity, upcoming camping/hiking - New puppy, maltie-poo, walking and more active - Weight down 12-13 lbs  He is interested in new med for DM and weight as discussed prior CBGs:Last 30 days - he has noticed - avg90 - 120, high>160+ x 3 results in past 30 days,low>100 Meds:MetforminER1000mg  BID wc(500 x2 BID)(without GI intolerance) Reports good compliance. Tolerating well w/o side-effects Currently on ARB Lifestyle: - Family history of obesity in family, no known diabetes in family, [place] heritage -Last DM Eye Dr Phineas Douglas - 07/2018 - - Due for [facility] -Need record from DM Eye will request Denies polyuria, polydipsia, numbness tingling weakness  CHRONIC HTN: Reportsno new concerns. He still checks BP outside office. Normal readings at home. Current Meds -Losartan 25mg  daily Reports good compliance, took meds today. Tolerating well, w/o complaints. Denies CP, dyspnea, HA, edema, dizziness / lightheadedness   Folliculitis Prior history last treated few years ago by me with forearm folliculitis, treated with doxycycline and it resolved. Using hibiclens, washing, has small pimple or pustule, now has recurrence.  Health Maintenance:  Consider initial Colonoscopy in 1 year when [place] pandemic is reduced. He declines now, offered cologuard also wants to hold off on that test.    Depression screen [facility] 2/9 01/17/2019 07/18/2018 03/14/2018  Decreased Interest 0 0 0  Down, Depressed, Hopeless 0 0 0  PHQ - 2 Score 0 0 0    Social History   Tobacco Use  . Smoking status: Never Smoker  . Smokeless tobacco: Never Used  Substance Use Topics  . Alcohol  use: No    Comment: occasionally 2 glasses wine   . Drug use: No    Review of Systems Per HPI unless specifically indicated above     Objective:    BP (!) 140/93   Pulse 87   Temp 98.6 F (37 C) (Temporal)   Resp 16   Ht 6\' 1"  (1.854 m)   Wt (!) 350 lb (158.8 kg)   BMI 46.18 kg/m   Wt Readings from Last 3 Encounters:  10/24/19 (!) 350 lb (158.8 kg)  07/24/19 (!) 362 lb 8 oz (164.4 kg)  01/17/19 (!) 340 lb (154.2 kg)    Physical Exam Vitals and nursing note reviewed.  Constitutional:      General: He is not in acute distress.    Appearance: He is well-developed. He is obese. He is not diaphoretic.     Comments: Well-appearing, comfortable, cooperative  HENT:     Head: Normocephalic and atraumatic.  Eyes:     General:        Right eye: No discharge.        Left eye: No discharge.     Conjunctiva/sclera: Conjunctivae normal.  Cardiovascular:     Rate and Rhythm: Normal rate.  Pulmonary:     Effort: Pulmonary effort is normal.  Skin:    General: Skin is warm and dry.     Findings: No erythema or rash.     Comments: Forearm folliculitis similar to before, several areas scab or break in skin, prior pustule  Neurological:     Mental Status: He is alert and oriented to person,  place, and time.  Psychiatric:        Behavior: Behavior normal.     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 No hyperglycemia now with lifestyle changes. Complication with mild peripheral neuropathy localized R foot only in past had similar symptoms ASCVD Risk as diabetic approx 3% - counseling in past on Statin - declined  Plan:  Offered GLP1 new start today, he agrees Ozempic 0.25mg  inj weekly x 4 weeks then up to 0.5mg  weekly x 2 weeks on sample pen 6 week total, however we are out of stock he will come by to pick up sample when we have by next week. He will let me know when ready for new rx. Copay card info given. Can order 84 day or 3 month supply. Review benefit risk side effect. Wt loss cardiovascular benefit. Will reduce metformin in future  1. CONTINUE Metformin ER 1000mg  BID (500 x 2 BID) 2. Encourage improved lifestyle - low carb, low sugar diet, reduce portion size, continue improving emphasis on regular exercise 3. Continue to check CBG up to 1-2x daily 4. Continue ARB - future consider ASA, Statin 5. F/u as scheduled DM Eye Exam Dr Kerin Ransom 6. Follow-up 3 months DM A1c med adjust      Relevant Medications   OZEMPIC, 0.25 OR 0.5 MG/DOSE, 2 MG/1.5ML SOPN   Other Relevant Orders   POCT HgB A1C (Completed)      Meds ordered this encounter  Medications  . doxycycline (VIBRA-TABS) 100 MG tablet    Sig: Take 1 tablet (100 mg total) by mouth 2 (two) times daily. For 10 days. Take with full glass of water, stay upright 30 min after taking.    Dispense:  20 tablet    Refill:  0  . mupirocin ointment (BACTROBAN) 2 %    Sig: Apply 1 application topically 2 (two) times daily. Up to 1-2 weeks as needed.    Dispense:  22 g    Refill:  2  . OZEMPIC, 0.25 OR 0.5 MG/DOSE, 2 MG/1.5ML SOPN    Sig: Inject 0.25 mg into the skin once a week. For first 4 weeks. Then increase dose to 0.5mg  weekly    Dispense:  1 pen     Refill:  0      Follow up plan: Return in about 3 months (around 01/24/2020) for 3 month DM A1c new med adjust.   [name], DO [facility] 10/24/2019, 8:14 AM

## 2019-10-24 NOTE — Assessment & Plan Note (Signed)
Controlled DM, A1c at 6.9 now, at goal overall No hypoglycemia. No hyperglycemia now with lifestyle changes. Complication with mild peripheral neuropathy localized R foot only in past had similar symptoms ASCVD Risk as diabetic approx 3% - counseling in past on Statin - declined  Plan:  Offered GLP1 new start today, he agrees Ozempic 0.25mg  inj weekly x 4 weeks then up to 0.5mg  weekly x 2 weeks on sample pen 6 week total, however we are out of stock he will come by to pick up sample when we have by next week. He will let me know when ready for new rx. Copay card info given. Can order 84 day or 3 month supply. Review benefit risk side effect. Wt loss cardiovascular benefit. Will reduce metformin in future  1. CONTINUE Metformin ER 1000mg  BID (500 x 2 BID) 2. Encourage improved lifestyle - low carb, low sugar diet, reduce portion size, continue improving emphasis on regular exercise 3. Continue to check CBG up to 1-2x daily 4. Continue ARB - future consider ASA, Statin 5. F/u as scheduled DM Eye Exam Dr Kerin Ransom 6. Follow-up 3 months DM A1c med adjust

## 2019-11-23 DIAGNOSIS — E1140 Type 2 diabetes mellitus with diabetic neuropathy, unspecified: Secondary | ICD-10-CM

## 2019-11-23 MED ORDER — OZEMPIC (0.25 OR 0.5 MG/DOSE) 2 MG/1.5ML ~~LOC~~ SOPN: 0.5000 mg | PEN_INJECTOR | SUBCUTANEOUS | 1 refills | Status: DC

## 2020-02-05 ENCOUNTER — Ambulatory Visit (INDEPENDENT_AMBULATORY_CARE_PROVIDER_SITE_OTHER): Payer: BC Managed Care – PPO | Admitting: Family Medicine

## 2020-02-05 ENCOUNTER — Other Ambulatory Visit: Payer: Self-pay

## 2020-02-05 ENCOUNTER — Other Ambulatory Visit: Payer: Self-pay | Admitting: Family Medicine

## 2020-02-05 ENCOUNTER — Encounter: Payer: Self-pay | Admitting: Family Medicine

## 2020-02-05 VITALS — BP 121/80 | HR 80 | Temp 97.7°F | Resp 16 | Ht 73.0 in | Wt 338.6 lb

## 2020-02-05 DIAGNOSIS — E1140 Type 2 diabetes mellitus with diabetic neuropathy, unspecified: Secondary | ICD-10-CM

## 2020-02-05 DIAGNOSIS — Z23 Encounter for immunization: Secondary | ICD-10-CM | POA: Diagnosis not present

## 2020-02-05 DIAGNOSIS — E559 Vitamin D deficiency, unspecified: Secondary | ICD-10-CM

## 2020-02-05 DIAGNOSIS — Z125 Encounter for screening for malignant neoplasm of prostate: Secondary | ICD-10-CM

## 2020-02-05 DIAGNOSIS — Z0000 Encounter for general adult medical examination without abnormal findings: Secondary | ICD-10-CM

## 2020-02-05 DIAGNOSIS — Z1159 Encounter for screening for other viral diseases: Secondary | ICD-10-CM

## 2020-02-05 DIAGNOSIS — I10 Essential (primary) hypertension: Secondary | ICD-10-CM

## 2020-02-05 DIAGNOSIS — Z6841 Body Mass Index (BMI) 40.0 and over, adult: Secondary | ICD-10-CM

## 2020-02-05 DIAGNOSIS — E785 Hyperlipidemia, unspecified: Secondary | ICD-10-CM

## 2020-02-05 LAB — POCT GLYCOSYLATED HEMOGLOBIN (HGB A1C): Hemoglobin A1C: 5.9 % — AB (ref 4.0–5.6)

## 2020-02-05 NOTE — Progress Notes (Signed)
Subjective:    Patient ID: Martin Sanders, male    DOB: 1971-05-14, 49 y.o.   MRN: [id number]  Martin Sanders is a 49 y.o. male presenting on 02/05/2020 for Diabetes   HPI   FOLLOW-UPCHRONIC DM, Type 2with Mild Peripheral Neuropathy// Morbid Obesity - Last visit with me 10/24/19, for same problem DM, treated with new start Ozempic, see prior notes for background information. - Interval update with dramatic improvement on new start ozempic in past 3 months approximately, he has titrated up dose from 0.25 up to 0.5mg , tolerating very well, he has no side effect, he has reduced appetite and weight loss significant, down 12 lbs in 3 months Due A1c today - New puppy, maltie-poo, walking and more active CBGs:Last 30 days - he has noticed - avg 104, high>140+ x 3 results in past 30 days Meds: - Ozempic 0.5mg  weekly inj, cost $25 per month or 75 for 3 month, doing well - still on MetforminER1000mg  BID wc(500 x2 BID)(without GI intolerance) but interested to reduce Reports good compliance. Tolerating well w/o side-effects Currently on ARB Lifestyle: - Family history of obesity in family, no known diabetes in family, [place] heritage -Last DM Eye Dr Phineas Douglas -07/2018 - - Due for [facility] -Need record from DM Eye will request, he has done regular check up glasses but not DM eye exam  Admits bilateral forefoot dorsal pain, sharp stabbing intermittent pain, not linked to physical activity, without numbness Denies polyuria, polydipsia, numbness tingling weakness   Health Maintenance:  Due for Flu Shot, will receive today     Depression screen [facility] 2/9 02/05/2020 01/17/2019 07/18/2018  Decreased Interest 0 0 0  Down, Depressed, Hopeless 0 0 0  PHQ - 2 Score 0 0 0    Social History   Tobacco Use  . Smoking status: Never Smoker  . Smokeless tobacco: Never Used  Substance Use Topics  . Alcohol use: No    Comment: occasionally 2 glasses wine   . Drug use: No    Review  of Systems Per HPI unless specifically indicated above     Objective:    BP 121/80   Pulse 80   Temp 97.7 F (36.5 C) (Temporal)   Resp 16   Ht 6\' 1"  (1.854 m)   Wt (!) 338 lb 9.6 oz (153.6 kg)   SpO2 98%   BMI 44.67 kg/m   Wt Readings from Last 3 Encounters:  02/05/20 (!) 338 lb 9.6 oz (153.6 kg)  10/24/19 (!) 350 lb (158.8 kg)  07/24/19 (!) 362 lb 8 oz (164.4 kg)    Physical Exam Vitals and nursing note reviewed.  Constitutional:      General: He is not in acute distress.    Appearance: He is well-developed. He is obese. He is not diaphoretic.     Comments: Well-appearing, comfortable, cooperative  HENT:     Head: Normocephalic and atraumatic.  Eyes:     General:        Right eye: No discharge.        Left eye: No discharge.     Conjunctiva/sclera: Conjunctivae normal.  Cardiovascular:     Rate and Rhythm: Normal rate.  Pulmonary:     Effort: Pulmonary effort is normal.  Skin:    General: Skin is warm and dry.     Findings: No erythema or rash.  Neurological:     Mental Status: He is alert and oriented to person, place, and time.  Psychiatric:  Behavior: Behavior normal.     Comments: Well groomed, good eye contact, normal speech and thoughts      Diabetic Foot Exam - Simple   Simple Foot Form Diabetic Foot exam was performed with the following findings: Yes 02/05/2020  8:30 AM  Visual Inspection See comments: Yes Sensation Testing Intact to touch and monofilament testing bilaterally: Yes Pulse Check Posterior Tibialis and Dorsalis pulse intact bilaterally: Yes Comments Mild callus formation heels, no ulceration. Intact monofilament bilateral. Prominent at MTP joint without obvious bunion    Recent Labs    07/17/19 0800 10/24/19 0818 02/05/20 0817  HGBA1C 7.0* 6.9* 5.9*    Results for orders placed or performed in visit on 02/05/20  POCT HgB A1C  Result Value Ref Range   Hemoglobin A1C 5.9 (A) 4.0 - 5.6 %      Assessment & Plan:    Problem List Items Addressed This Visit    Controlled type 2 diabetes mellitus with diabetic neuropathy ([place]) - Primary    Dramatic improvement on A1c now down to 5.9 on GLP1 ozempic No hypoglycemia or hyperglycemia Complication with mild peripheral neuropathy localized R foot only in past had similar symptoms The 10-year ASCVD risk score Mikey Bussing DC Jr., et al., 2013) is: 6.3%    Plan:  1. CONTINUE Ozempic 0.5mg  weekly inj GLP1 - we discussed titration goal - Now REDUCE Metformin XR instead of 528m x 2 = 1000mg  BID - he will start to reduce by 500mg  every 1 week for next 4-6 weeks until possibly tapered OFF Metformin, based on his CBG readings can determine if we should keep on some level of metformin or come off - Goal is to INCREASE Ozempic from 0.5 up to 1mg  weekly, will need new order, he should only fill 30 day fill of next ozempic 0.5 to trial this longer, message or call when ready for me to order the new ozempic 1mg  weekly dose  2. Encourage improved lifestyle - low carb, low sugar diet, reduce portion size, continue improving emphasis on regular exercise 3. Continue to check CBG up to 1-2x daily 4. Continue ARB - future consider ASA, Statin 5. F/u as scheduled DM Eye Exam Dr Kerin Ransom - already had glasses visit but not DM eye - DM Foot exam done - likely pes planus / structural cause for his symptoms, less likely neuropathy  F/u 6 month for annual, but likely increase ozempic before that time.      Relevant Orders   POCT HgB A1C (Completed)    Other Visit Diagnoses    Needs flu shot       Relevant Orders   Flu Vaccine QUAD 36+ mos IM (Completed)      No orders of the defined types were placed in this encounter.     Follow up plan: Return in about 6 months (around 08/04/2020) for Follow-up 6 month fasting lab only then 1 week later Annual Physical.  Future labs ordered for 07/23/19 Vitamin D, TSH PSA - CMET LIPID CBC HEP C   [name], DO [facility] 02/05/2020, 8:17 AM

## 2020-02-05 NOTE — Assessment & Plan Note (Addendum)
Dramatic improvement on A1c now down to 5.9 on GLP1 ozempic No hypoglycemia or hyperglycemia Complication with mild peripheral neuropathy localized R foot only in past had similar symptoms The 10-year ASCVD risk score Mikey Bussing DC Jr., et al., 2013) is: 6.3%    Plan:  1. CONTINUE Ozempic 0.5mg  weekly inj GLP1 - we discussed titration goal - Now REDUCE Metformin XR instead of 583m x 2 = 1000mg  BID - he will start to reduce by 500mg  every 1 week for next 4-6 weeks until possibly tapered OFF Metformin, based on his CBG readings can determine if we should keep on some level of metformin or come off - Goal is to INCREASE Ozempic from 0.5 up to 1mg  weekly, will need new order, he should only fill 30 day fill of next ozempic 0.5 to trial this longer, message or call when ready for me to order the new ozempic 1mg  weekly dose  2. Encourage improved lifestyle - low carb, low sugar diet, reduce portion size, continue improving emphasis on regular exercise 3. Continue to check CBG up to 1-2x daily 4. Continue ARB - future consider ASA, Statin 5. F/u as scheduled DM Eye Exam Dr Kerin Ransom - already had glasses visit but not DM eye - DM Foot exam done - likely pes planus / structural cause for his symptoms, less likely neuropathy  F/u 6 month for annual, but likely increase ozempic before that time.

## 2020-02-05 NOTE — Patient Instructions (Addendum)
Thank you for coming to the office today.  Recent Labs    07/17/19 0800 10/24/19 0818 02/05/20 0817  HGBA1C 7.0* 6.9* 5.9*   Keep Ozemipc 0.5mg  weekly for now.  Reduce Metformin XR - by 1 fewer pill a week for next 4 weeks. May be able to stop completely by 1 month if you are doing very well  In future we can double Ozempic to 1mg   - please message before the 6 month apt so we can make this change, keep in mind - notify me BEFORE you pick up the 90 day Ozempic, you can ask for only 30 day at the pharmacy or ask me to change it.  Weight down 350 to 338 lbs  Recommend scheduling Diabetic Eye Exam with Dr Kerin Ransom send [place] copy  I think the foot pain is likely biomechanical as we reviewed. Try orthotic or inserts offloading  DUE for FASTING BLOOD WORK (no food or drink after midnight before the lab appointment, only water or coffee without cream/sugar on the morning of)  SCHEDULE "Lab Only" visit in the morning at the clinic for lab draw in 6 MONTHS   - Make sure Lab Only appointment is at about 1 week before your next appointment, so that results will be available  For Lab Results, once available within 2-3 days of blood draw, you can can log in to MyChart online to view your results and a brief explanation. Also, we can discuss results at next follow-up visit.     Please schedule a Follow-up Appointment to: Return in about 6 months (around 08/04/2020) for Follow-up 6 month fasting lab only then 1 week later Annual Physical.  If you have any other questions or concerns, please feel free to call the office or send a message through [place]. You may also schedule an earlier appointment if necessary.  Additionally, you may be receiving a survey about your experience at our office within a few days to 1 week by e-mail or mail. We value your feedback.  [name], DO [place]

## 2020-03-02 DIAGNOSIS — E1140 Type 2 diabetes mellitus with diabetic neuropathy, unspecified: Secondary | ICD-10-CM

## 2020-03-04 MED ORDER — OZEMPIC (1 MG/DOSE) 4 MG/3ML ~~LOC~~ SOPN: 1.0000 mg | PEN_INJECTOR | SUBCUTANEOUS | 1 refills | Status: DC

## 2020-05-01 ENCOUNTER — Other Ambulatory Visit: Payer: Self-pay | Admitting: Family Medicine

## 2020-05-01 DIAGNOSIS — E1140 Type 2 diabetes mellitus with diabetic neuropathy, unspecified: Secondary | ICD-10-CM

## 2020-05-22 DIAGNOSIS — E1140 Type 2 diabetes mellitus with diabetic neuropathy, unspecified: Secondary | ICD-10-CM

## 2020-05-22 MED ORDER — OZEMPIC (0.25 OR 0.5 MG/DOSE) 2 MG/1.5ML ~~LOC~~ SOPN: 0.5000 mg | PEN_INJECTOR | SUBCUTANEOUS | 1 refills | Status: DC

## 2020-06-27 ENCOUNTER — Other Ambulatory Visit: Payer: Self-pay | Admitting: Family Medicine

## 2020-06-27 DIAGNOSIS — E1140 Type 2 diabetes mellitus with diabetic neuropathy, unspecified: Secondary | ICD-10-CM

## 2020-06-29 ENCOUNTER — Other Ambulatory Visit: Payer: Self-pay | Admitting: Family Medicine

## 2020-06-29 DIAGNOSIS — E1140 Type 2 diabetes mellitus with diabetic neuropathy, unspecified: Secondary | ICD-10-CM

## 2020-07-22 ENCOUNTER — Other Ambulatory Visit: Payer: Self-pay

## 2020-07-22 ENCOUNTER — Other Ambulatory Visit: Payer: BC Managed Care – PPO

## 2020-07-22 DIAGNOSIS — E1140 Type 2 diabetes mellitus with diabetic neuropathy, unspecified: Secondary | ICD-10-CM

## 2020-07-22 DIAGNOSIS — Z0000 Encounter for general adult medical examination without abnormal findings: Secondary | ICD-10-CM

## 2020-07-22 DIAGNOSIS — I10 Essential (primary) hypertension: Secondary | ICD-10-CM

## 2020-07-22 DIAGNOSIS — E1169 Type 2 diabetes mellitus with other specified complication: Secondary | ICD-10-CM

## 2020-07-22 DIAGNOSIS — Z1159 Encounter for screening for other viral diseases: Secondary | ICD-10-CM

## 2020-07-22 DIAGNOSIS — E559 Vitamin D deficiency, unspecified: Secondary | ICD-10-CM

## 2020-07-22 DIAGNOSIS — Z125 Encounter for screening for malignant neoplasm of prostate: Secondary | ICD-10-CM

## 2020-07-23 LAB — CBC WITH DIFFERENTIAL/PLATELET
Absolute Monocytes: 502 cells/uL (ref 200–950)
Basophils Absolute: 53 cells/uL (ref 0–200)
Basophils Relative: 0.7 %
Eosinophils Absolute: 433 cells/uL (ref 15–500)
Eosinophils Relative: 5.7 %
HCT: 48.1 % (ref 38.5–50.0)
Hemoglobin: 16.1 g/dL (ref 13.2–17.1)
Lymphs Abs: 1619 cells/uL (ref 850–3900)
MCH: 27.5 pg (ref 27.0–33.0)
MCHC: 33.5 g/dL (ref 32.0–36.0)
MCV: 82.1 fL (ref 80.0–100.0)
MPV: 10 fL (ref 7.5–12.5)
Monocytes Relative: 6.6 %
Neutro Abs: 4993 cells/uL (ref 1500–7800)
Neutrophils Relative %: 65.7 %
Platelets: 263 10*3/uL (ref 140–400)
RBC: 5.86 10*6/uL — ABNORMAL HIGH (ref 4.20–5.80)
RDW: 13.6 % (ref 11.0–15.0)
Total Lymphocyte: 21.3 %
WBC: 7.6 10*3/uL (ref 3.8–10.8)

## 2020-07-23 LAB — HEPATITIS C ANTIBODY
Hepatitis C Ab: NONREACTIVE
SIGNAL TO CUT-OFF: 0.01 (ref <1.00)

## 2020-07-23 LAB — COMPLETE METABOLIC PANEL WITH GFR
AG Ratio: 1.7 (calc) (ref 1.0–2.5)
ALT: 21 U/L (ref 9–46)
AST: 12 U/L (ref 10–35)
Albumin: 3.8 g/dL (ref 3.6–5.1)
Alkaline phosphatase (APISO): 76 U/L (ref 35–144)
BUN: 11 mg/dL (ref 7–25)
CO2: 22 mmol/L (ref 20–32)
Calcium: 8.6 mg/dL (ref 8.6–10.3)
Chloride: 107 mmol/L (ref 98–110)
Creat: 0.72 mg/dL (ref 0.70–1.33)
GFR, Est African American: 124 mL/min/{1.73_m2} (ref >=60)
GFR, Est Non African American: 107 mL/min/{1.73_m2} (ref >=60)
Globulin: 2.3 g/dL (calc) (ref 1.9–3.7)
Glucose, Bld: 134 mg/dL — ABNORMAL HIGH (ref 65–99)
Potassium: 4.2 mmol/L (ref 3.5–5.3)
Sodium: 140 mmol/L (ref 135–146)
Total Bilirubin: 0.3 mg/dL (ref 0.2–1.2)
Total Protein: 6.1 g/dL (ref 6.1–8.1)

## 2020-07-23 LAB — LIPID PANEL
Cholesterol: 149 mg/dL (ref <200)
HDL: 42 mg/dL (ref >=40)
LDL Cholesterol (Calc): 77 mg/dL (calc)
Non-HDL Cholesterol (Calc): 107 mg/dL (calc) (ref <130)
Total CHOL/HDL Ratio: 3.5 (calc) (ref <5.0)
Triglycerides: 208 mg/dL — ABNORMAL HIGH (ref <150)

## 2020-07-23 LAB — HEMOGLOBIN A1C
Hgb A1c MFr Bld: 6.3 % of total Hgb — ABNORMAL HIGH (ref <5.7)
Mean Plasma Glucose: 134 mg/dL
eAG (mmol/L): 7.4 mmol/L

## 2020-07-23 LAB — TSH: TSH: 2.57 mIU/L (ref 0.40–4.50)

## 2020-07-23 LAB — PSA: PSA: 0.41 ng/mL (ref <=4.0)

## 2020-07-23 LAB — VITAMIN D 25 HYDROXY (VIT D DEFICIENCY, FRACTURES): Vit D, 25-Hydroxy: 20 ng/mL — ABNORMAL LOW (ref 30–100)

## 2020-07-29 ENCOUNTER — Other Ambulatory Visit: Payer: Self-pay | Admitting: Family Medicine

## 2020-07-29 ENCOUNTER — Ambulatory Visit (INDEPENDENT_AMBULATORY_CARE_PROVIDER_SITE_OTHER): Payer: BC Managed Care – PPO | Admitting: Family Medicine

## 2020-07-29 ENCOUNTER — Encounter: Payer: Self-pay | Admitting: Family Medicine

## 2020-07-29 ENCOUNTER — Other Ambulatory Visit: Payer: Self-pay

## 2020-07-29 VITALS — BP 128/71 | HR 76 | Ht 73.0 in | Wt 342.5 lb

## 2020-07-29 DIAGNOSIS — Z0000 Encounter for general adult medical examination without abnormal findings: Secondary | ICD-10-CM

## 2020-07-29 DIAGNOSIS — N529 Male erectile dysfunction, unspecified: Secondary | ICD-10-CM

## 2020-07-29 DIAGNOSIS — E1140 Type 2 diabetes mellitus with diabetic neuropathy, unspecified: Secondary | ICD-10-CM | POA: Diagnosis not present

## 2020-07-29 DIAGNOSIS — I10 Essential (primary) hypertension: Secondary | ICD-10-CM

## 2020-07-29 DIAGNOSIS — Z6841 Body Mass Index (BMI) 40.0 and over, adult: Secondary | ICD-10-CM

## 2020-07-29 DIAGNOSIS — Z23 Encounter for immunization: Secondary | ICD-10-CM | POA: Diagnosis not present

## 2020-07-29 DIAGNOSIS — E785 Hyperlipidemia, unspecified: Secondary | ICD-10-CM

## 2020-07-29 DIAGNOSIS — L739 Follicular disorder, unspecified: Secondary | ICD-10-CM

## 2020-07-29 DIAGNOSIS — E1169 Type 2 diabetes mellitus with other specified complication: Secondary | ICD-10-CM

## 2020-07-29 DIAGNOSIS — E559 Vitamin D deficiency, unspecified: Secondary | ICD-10-CM

## 2020-07-29 MED ORDER — SILDENAFIL CITRATE 20 MG PO TABS: 80.0000 mg | ORAL_TABLET | ORAL | 6 refills | Status: DC | PRN

## 2020-07-29 MED ORDER — SHINGRIX 50 MCG/0.5ML IM SUSR: INTRAMUSCULAR | 1 refills | Status: DC

## 2020-07-29 MED ORDER — DOXYCYCLINE HYCLATE 100 MG PO TABS: 100.0000 mg | ORAL_TABLET | Freq: Two times a day (BID) | ORAL | 0 refills | Status: DC

## 2020-07-29 MED ORDER — METFORMIN HCL ER 500 MG PO TB24: 500.0000 mg | ORAL_TABLET | Freq: Two times a day (BID) | ORAL | 3 refills | Status: DC

## 2020-07-29 MED ORDER — TRULICITY 0.75 MG/0.5ML ~~LOC~~ SOAJ: 0.7500 mg | SUBCUTANEOUS | 0 refills | Status: DC

## 2020-07-29 NOTE — Patient Instructions (Addendum)
Thank you for coming to the office today.  Stop Ozemipc Start Trulicity 0.75mg , eventually after 3+ may switch to higher dose 1.5mg  if need  Refilled medications Printed Sildenafil Printed SHingrix can get at pharmacy or here.  Future recommend Colon Screening.  Please schedule a Follow-up Appointment to: Return in about 6 months (around 01/26/2021) for 6 month DM A1c.  If you have any other questions or concerns, please feel free to call the office or send a message through [place]. You may also schedule an earlier appointment if necessary.  Additionally, you may be receiving a survey about your experience at our office within a few days to 1 week by e-mail or mail. We value your feedback.  [name], DO [place]

## 2020-07-29 NOTE — Assessment & Plan Note (Signed)
Elevated A1c to 6.3, still controlled Now off GLP1 ozempic 1 month No hypoglycemia or hyperglycemia Complication with mild peripheral neuropathy localized R foot only in past had similar symptoms The 10-year ASCVD risk score Mikey Bussing DC Jr., et al., 2013) is: 7.6%    Plan:  1. SWITCH GLP1 therapy to Trulicity sample 0.75mg  weekly inj, x 2 week sample given, he can notify us within 1-2 weeks when ready for new rx Trulicity 7.78 sent to pharmacy. Future goal titrate to 1.5 if tolerated. Hope to have reduced GI Side effect 2. Continue Metformin XR 500 BID 3. Encourage improved lifestyle - low carb, low sugar diet, reduce portion size, continue improving emphasis on regular exercise 4. Continue to check CBG up to 1-2x daily 5. Continue ARB - future consider ASA, Statin 6. F/u as scheduled DM Eye Exam Dr Kerin Ransom - already had glasses visit but not DM eye

## 2020-07-29 NOTE — Progress Notes (Signed)
Subjective:    Patient ID: Martin Sanders, male    DOB: [birth year], 50 y.o.   MRN: [id number]  Martin Sanders is a 50 y.o. male presenting on 07/29/2020 for Diabetes (Wants to come off Ozempic because it is making him sick.) and Annual Exam   HPI   Here for Annual Physical and Lab Review.  FOLLOW-UPCHRONIC DM, Type 2with Mild Peripheral Neuropathy// Morbid Obesity Controlled Diabetes in past, some fluctuation A1c Last result A1c up to 6.3, from 5.9, had been mid 6 to highest 7.0 in past 1-2 years. He started Ozempic 10/2019, had tolerated well, but did have some GI intolerance closer to 03/2020 and 04/2020 after COVID vaccine and had sour stomach and worsening GI symptoms nausea vomiting, and worse after vaccine. Interval update he has tried to reduce dose down to lowest 0.57m with still having side effect  He did have improvement on GLP1 therapy in past. And had wt loss and sugar control now not tolerating well He is active, walking with puppy CBGs:Last 30 days -CBG fluctuated some - 1 month ago he has stopped Ozempic 0.210mdosage, we reduced dosage to avoid GI side effect. - still on MetforminER10004mID wc(500 x2 BID)(without GI intolerance) but interested to reduce Reports good compliance. Tolerating well w/o side-effects Currently on ARB Lifestyle: - Family history of obesity in family, no known diabetes in family, [place] -[facility]  will request, he has done regular check up glasses but not DM eye exam  Admits bilateral forefoot dorsal pain, sharp stabbing intermittent pain, not linked to physical activity, without numbness Denies polyuria, polydipsia, numbness tingling weakness  CHRONIC HTN: Reportsno new concerns. He still checks BP outside office. Normal readings at home. Current Meds -Losartan 21m29mily Reports good compliance, took meds today. Tolerating well, w/o complaints. Denies CP, dyspnea, HA,  edema, dizziness / lightheadedness   Erectile Dysfunction Controlled on Sildenafil PRN, refill order today.   Health Maintenance:  ConsiderinitialColonoscopy [place].He declines now, offered cologuard also wants to hold off on that test.  Shingrix vaccine due, will print today. Day 0 and then repeat dose 2-6 months.  Routine screening Hepatitis C negative.  PSA 0.41 (negative screening for prostate cancer)  Depression screen PHQ [facility] 02/05/2020 01/17/2019 07/18/2018  Decreased Interest 0 0 0  Down, Depressed, Hopeless 0 0 0  PHQ - 2 Score 0 0 0    No past medical history on file. Past Surgical History:  Procedure Laterality Date  . KNEE ARTHROSCOPY AND ARTHROTOMY     Social History   Socioeconomic History  . Marital status: Married    Spouse name: Not on file  . Number of children: Not on file  . Years of education: Not on file  . Highest education level: Not on file  Occupational History  . Not on file  Tobacco Use  . Smoking status: Never Smoker  . Smokeless tobacco: Never Used  Substance and Sexual Activity  . Alcohol use: No    Comment: occasionally 2 glasses wine   . Drug use: No  . Sexual activity: Not on file  Other Topics Concern  . Not on file  Social History Narrative  . Not on file   Social Determinants of Health   Financial Resource Strain: Not on file  Food Insecurity: Not on file  Transportation Needs: Not on file  Physical Activity: Not on file  Stress: Not on file  Social Connections: Not on file  Intimate Partner Violence: Not on file   No family history on file. Current Outpatient Medications on File Prior to Visit  Medication Sig  . Accu-Chek FastClix Lancets MISC Check sugar up to 2 x daily  . Alpha-Lipoic Acid 100 MG CAPS Take 1 capsule (100 mg total) by mouth daily.  . Blood Glucose Monitoring Suppl (CONTOUR NEXT EZ MONITOR) w/Device KIT 1 each by Does not apply route 2 (two) times daily. Dx  E11.9  . cetirizine-pseudoephedrine (ZYRTEC-D) 5-120 MG tablet Take by mouth.  . Cholecalciferol (VITAMIN D3) 125 MCG (5000 UT) CAPS Take 5,000 Units by mouth once a week.  . glucose blood (ACCU-CHEK GUIDE) test strip USE TO CHECK BLOOD SUGAR UP TO 2 X DAILY  . losartan (COZAAR) 25 MG tablet TAKE 1 TABLET BY MOUTH EVERY DAY  . mupirocin ointment (BACTROBAN) 2 % Apply 1 application topically 2 (two) times daily. Up to 1-2 weeks as needed.   No current facility-administered medications on file prior to visit.    Review of Systems  Constitutional: Negative for activity change, appetite change, chills, diaphoresis, fatigue and fever.  HENT: Negative for congestion and hearing loss.   Eyes: Negative for visual disturbance.  Respiratory: Negative for cough, chest tightness, shortness of breath and wheezing.   Cardiovascular: Negative for chest pain, palpitations and leg swelling.  Gastrointestinal: Negative for abdominal pain, constipation, diarrhea, nausea and vomiting.  Genitourinary: Negative for dysuria, frequency and hematuria.  Musculoskeletal: Negative for arthralgias and neck pain.  Skin: Negative for rash.  Allergic/Immunologic: Negative for environmental allergies.  Neurological: Negative for dizziness, weakness, light-headedness, numbness and headaches.  Hematological: Negative for adenopathy.  Psychiatric/Behavioral: Negative for behavioral problems, dysphoric mood and sleep disturbance.   Per HPI unless specifically indicated above      Objective:    BP 128/71   Pulse 76   Ht 6' 1" (1.854 m)   Wt (!) 342 lb 8 oz (155.4 kg)   SpO2 98%   BMI 45.19 kg/m   Wt Readings from Last 3 Encounters:  07/29/20 (!) 342 lb 8 oz (155.4 kg)  02/05/20 (!) 338 lb 9.6 oz (153.6 kg)  10/24/19 (!) 350 lb (158.8 kg)    Physical Exam Vitals and nursing note reviewed.  Constitutional:      General: He is not in acute distress.    Appearance: He is well-developed and well-nourished. He is  not diaphoretic.     Comments: Well-appearing, comfortable, cooperative  HENT:     Head: Normocephalic and atraumatic.     Mouth/Throat:     Mouth: Oropharynx is clear and moist.  Eyes:     General:        Right eye: No discharge.        Left eye: No discharge.     Extraocular Movements: EOM normal.     Conjunctiva/sclera: Conjunctivae normal.     Pupils: Pupils are equal, round, and reactive to light.  Neck:     Thyroid: No thyromegaly.  Cardiovascular:     Rate and Rhythm: Normal rate and regular rhythm.     Pulses: Intact distal pulses.     Heart sounds: Normal heart sounds. No murmur heard.   Pulmonary:     Effort: Pulmonary effort is normal. No respiratory distress.     Breath sounds: Normal breath sounds. No wheezing or rales.  Abdominal:     General: Bowel sounds are normal. There is no distension.       Palpations: Abdomen is soft. There is no mass.     Tenderness: There is no abdominal tenderness.  Musculoskeletal:        General: No tenderness or edema. Normal range of motion.     Cervical back: Normal range of motion and neck supple.     Right lower leg: No edema.     Left lower leg: No edema.     Comments: Upper / Lower Extremities: - Normal muscle tone, strength bilateral upper extremities 5/5, lower extremities 5/5  Lymphadenopathy:     Cervical: No cervical adenopathy.  Skin:    General: Skin is warm and dry.     Findings: No erythema or rash.  Neurological:     Mental Status: He is alert and oriented to person, place, and time.     Comments: Distal sensation intact to light touch all extremities  Psychiatric:        Mood and Affect: Mood and affect normal.        Behavior: Behavior normal.     Comments: Well groomed, good eye contact, normal speech and thoughts       Results for orders placed or performed in visit on 07/22/20  Hepatitis C antibody  Result Value Ref Range   Hepatitis C Ab NON-REACTIVE NON-REACTI   SIGNAL TO CUT-OFF 0.01 <1.00  TSH   Result Value Ref Range   TSH 2.57 0.40 - 4.50 mIU/L  VITAMIN D 25 Hydroxy (Vit-D Deficiency, Fractures)  Result Value Ref Range   Vit D, 25-Hydroxy 20 (L) 30 - 100 ng/mL  PSA  Result Value Ref Range   PSA 0.41 < OR = 4.0 ng/mL  Lipid panel  Result Value Ref Range   Cholesterol 149 <200 mg/dL   HDL 42 > OR = 40 mg/dL   Triglycerides 208 (H) <150 mg/dL   LDL Cholesterol (Calc) 77 mg/dL (calc)   Total CHOL/HDL Ratio 3.5 <5.0 (calc)   Non-HDL Cholesterol (Calc) 107 <130 mg/dL (calc)  COMPLETE METABOLIC PANEL WITH GFR  Result Value Ref Range   Glucose, Bld 134 (H) 65 - 99 mg/dL   BUN 11 7 - 25 mg/dL   Creat 0.72 0.70 - 1.33 mg/dL   GFR, Est Non African American 107 > OR = 60 mL/min/1.57m   GFR, Est African American 124 > OR = 60 mL/min/1.75m  BUN/Creatinine Ratio NOT APPLICABLE 6 - 22 (calc)   Sodium 140 135 - 146 mmol/L   Potassium 4.2 3.5 - 5.3 mmol/L   Chloride 107 98 - 110 mmol/L   CO2 22 20 - 32 mmol/L   Calcium 8.6 8.6 - 10.3 mg/dL   Total Protein 6.1 6.1 - 8.1 g/dL   Albumin 3.8 3.6 - 5.1 g/dL   Globulin 2.3 1.9 - 3.7 g/dL (calc)   AG Ratio 1.7 1.0 - 2.5 (calc)   Total Bilirubin 0.3 0.2 - 1.2 mg/dL   Alkaline phosphatase (APISO) 76 35 - 144 U/L   AST 12 10 - 35 U/L   ALT 21 9 - 46 U/L  CBC with Differential/Platelet  Result Value Ref Range   WBC 7.6 3.8 - 10.8 Thousand/uL   RBC 5.86 (H) 4.20 - 5.80 Million/uL   Hemoglobin 16.1 13.2 - 17.1 g/dL   HCT 48.1 38.5 - 50.0 %   MCV 82.1 80.0 - 100.0 fL   MCH 27.5 27.0 - 33.0 pg   MCHC 33.5 32.0 - 36.0 g/dL   RDW 13.6 11.0 - 15.0 %   Platelets 263 140 -  400 Thousand/uL   MPV 10.0 7.5 - 12.5 fL   Neutro Abs 4,993 1,500 - 7,800 cells/uL   Lymphs Abs 1,619 850 - 3,900 cells/uL   Absolute Monocytes 502 200 - 950 cells/uL   Eosinophils Absolute 433 15 - 500 cells/uL   Basophils Absolute 53 0 - 200 cells/uL   Neutrophils Relative % 65.7 %   Total Lymphocyte 21.3 %   Monocytes Relative 6.6 %   Eosinophils Relative 5.7  %   Basophils Relative 0.7 %  Hemoglobin A1c  Result Value Ref Range   Hgb A1c MFr Bld 6.3 (H) <5.7 % of total Hgb   Mean Plasma Glucose 134 mg/dL   eAG (mmol/L) 7.4 mmol/L      Assessment & Plan:   Problem List Items Addressed This Visit    Vitamin D deficiency   Morbid obesity with BMI of 45.0-49.9, adult (HCC)   Relevant Medications   TRULICITY 0.75 MG/0.5ML SOPN   metFORMIN (GLUCOPHAGE-XR) 500 MG 24 hr tablet   Hyperlipidemia associated with type 2 diabetes mellitus (HCC)    Controlled cholesterol not on statin Last lipid panel 07/2020  Plan: 1. Future may warrant statin therapy / ASA for ASCVD risk reduction 2. Encourage improved lifestyle - low carb/cholesterol, reduce portion size, continue improving regular exercise      Relevant Medications   TRULICITY 0.75 MG/0.5ML SOPN   metFORMIN (GLUCOPHAGE-XR) 500 MG 24 hr tablet   Essential hypertension   Relevant Medications   sildenafil (REVATIO) 20 MG tablet   ED (erectile dysfunction)   Relevant Medications   sildenafil (REVATIO) 20 MG tablet   Controlled type 2 diabetes mellitus with diabetic neuropathy (HCC)    Elevated A1c to 6.3, still controlled Now off GLP1 ozempic 1 month No hypoglycemia or hyperglycemia Complication with mild peripheral neuropathy localized R foot only in past had similar symptoms The 10-year ASCVD risk score (Goff DC Jr., et al., 2013) is: 7.6%    Plan:  1. SWITCH GLP1 therapy to Trulicity sample 0.75mg weekly inj, x 2 week sample given, he can notify us within 1-2 weeks when ready for new rx Trulicity 0.75 sent to pharmacy. Future goal titrate to 1.5 if tolerated. Hope to have reduced GI Side effect 2. Continue Metformin XR 500 BID 3. Encourage improved lifestyle - low carb, low sugar diet, reduce portion size, continue improving emphasis on regular exercise 4. Continue to check CBG up to 1-2x daily 5. Continue ARB - future consider ASA, Statin 6. F/u as scheduled DM Eye Exam Dr Ritter -  already had glasses visit but not DM eye      Relevant Medications   TRULICITY 0.75 MG/0.5ML SOPN   metFORMIN (GLUCOPHAGE-XR) 500 MG 24 hr tablet    Other Visit Diagnoses    Annual physical exam    -  Primary   Need for shingles vaccine       Relevant Medications   SHINGRIX injection      Updated Health Maintenance information Defer Colonoscopy or Colon CA Screening PSA negative Shingrix printed today UTD COVID Reviewed recent lab results with patient Encouraged improvement to lifestyle with diet and exercise - Goal of weight loss  ED Refill med, printed    Meds ordered this encounter  Medications  . SHINGRIX injection    Sig: Inject 0.5 mL into arm for shingles vaccine, then repeat 2-6 months later for 2nd dose.    Dispense:  0.5 mL    Refill:  1  . TRULICITY 0.75 MG/0.5ML SOPN      Sig: Inject 0.75 mg into the skin once a week.    Dispense:  1 mL    Refill:  0  . metFORMIN (GLUCOPHAGE-XR) 500 MG 24 hr tablet    Sig: Take 1 tablet (500 mg total) by mouth 2 (two) times daily.    Dispense:  180 tablet    Refill:  3  . sildenafil (REVATIO) 20 MG tablet    Sig: Take 4 tablets (80 mg total) by mouth as needed (prior to sex).    Dispense:  50 tablet    Refill:  6     Follow up plan: Return in about 6 months (around 01/26/2021) for 6 month DM A1c.  Alexander Karamalegos, DO [facility] 07/29/2020, 8:25 AM 

## 2020-07-29 NOTE — Assessment & Plan Note (Signed)
Controlled cholesterol not on statin Last lipid panel 07/2020  Plan: 1. Future may warrant statin therapy / ASA for ASCVD risk reduction 2. Encourage improved lifestyle - low carb/cholesterol, reduce portion size, continue improving regular exercise

## 2020-08-01 ENCOUNTER — Other Ambulatory Visit: Payer: Self-pay | Admitting: Family Medicine

## 2020-08-01 DIAGNOSIS — I10 Essential (primary) hypertension: Secondary | ICD-10-CM

## 2020-08-01 DIAGNOSIS — E1140 Type 2 diabetes mellitus with diabetic neuropathy, unspecified: Secondary | ICD-10-CM

## 2020-08-13 DIAGNOSIS — E1140 Type 2 diabetes mellitus with diabetic neuropathy, unspecified: Secondary | ICD-10-CM

## 2020-08-13 MED ORDER — TRULICITY 0.75 MG/0.5ML ~~LOC~~ SOAJ: 0.7500 mg | SUBCUTANEOUS | 1 refills | Status: DC

## 2021-01-15 ENCOUNTER — Other Ambulatory Visit: Payer: Self-pay | Admitting: Family Medicine

## 2021-01-15 DIAGNOSIS — I10 Essential (primary) hypertension: Secondary | ICD-10-CM

## 2021-01-15 DIAGNOSIS — E1140 Type 2 diabetes mellitus with diabetic neuropathy, unspecified: Secondary | ICD-10-CM

## 2021-01-27 ENCOUNTER — Ambulatory Visit: Payer: BC Managed Care – PPO | Admitting: Family Medicine

## 2021-02-04 ENCOUNTER — Encounter: Payer: Self-pay | Admitting: Family Medicine

## 2021-02-04 ENCOUNTER — Other Ambulatory Visit: Payer: Self-pay | Admitting: Family Medicine

## 2021-02-04 ENCOUNTER — Ambulatory Visit: Payer: BC Managed Care – PPO | Admitting: Family Medicine

## 2021-02-04 ENCOUNTER — Other Ambulatory Visit: Payer: Self-pay

## 2021-02-04 VITALS — BP 150/81 | HR 71 | Ht 73.0 in | Wt 350.0 lb

## 2021-02-04 DIAGNOSIS — E785 Hyperlipidemia, unspecified: Secondary | ICD-10-CM

## 2021-02-04 DIAGNOSIS — Z6841 Body Mass Index (BMI) 40.0 and over, adult: Secondary | ICD-10-CM

## 2021-02-04 DIAGNOSIS — E1169 Type 2 diabetes mellitus with other specified complication: Secondary | ICD-10-CM

## 2021-02-04 DIAGNOSIS — E1140 Type 2 diabetes mellitus with diabetic neuropathy, unspecified: Secondary | ICD-10-CM

## 2021-02-04 DIAGNOSIS — I10 Essential (primary) hypertension: Secondary | ICD-10-CM

## 2021-02-04 DIAGNOSIS — Z0000 Encounter for general adult medical examination without abnormal findings: Secondary | ICD-10-CM

## 2021-02-04 DIAGNOSIS — Z125 Encounter for screening for malignant neoplasm of prostate: Secondary | ICD-10-CM

## 2021-02-04 LAB — POCT GLYCOSYLATED HEMOGLOBIN (HGB A1C): Hemoglobin A1C: 4.2 % (ref 4.0–5.6)

## 2021-02-04 MED ORDER — ACCU-CHEK GUIDE VI STRP: ORAL_STRIP | 3 refills | Status: DC

## 2021-02-04 NOTE — Patient Instructions (Addendum)
Thank you for coming to the office today.  Keep on target for Colonoscopy in Spring 2023  Eye Exam in Spring 2023  Foot exam done today.  Recent Labs    07/22/20 0754 02/04/21 0909  HGBA1C 6.3* 4.2    Continue Trulicity 0.'75mg'$  weekly with meal - may need to dose increase.  DUE for FASTING BLOOD WORK (no food or drink after midnight before the lab appointment, only water or coffee without cream/sugar on the morning of)  SCHEDULE "Lab Only" visit in the morning at the clinic for lab draw in 6 MONTHS   - Make sure Lab Only appointment is at about 1 week before your next appointment, so that results will be available  For Lab Results, once available within 2-3 days of blood draw, you can can log in to MyChart online to view your results and a brief explanation. Also, we can discuss results at next follow-up visit.    Please schedule a Follow-up Appointment to: Return in about 6 months (around 08/04/2021) for 6 month fasting lab only then 1 week later Annual Physical.  If you have any other questions or concerns, please feel free to call the office or send a message through [place]. You may also schedule an earlier appointment if necessary.  Additionally, you may be receiving a survey about your experience at our office within a few days to 1 week by e-mail or mail. We value your feedback.  [name], DO [place]

## 2021-02-04 NOTE — Assessment & Plan Note (Signed)
Dramatically improved A1c down to 4.2, question accuracy but seems consistent with lower CBG On GLP1 Trulicity now tolerating better less side effect taking w/ meal  No hypoglycemia or hyperglycemia Complication with mild peripheral neuropathy localized R foot only in past had similar symptoms The 10-year ASCVD risk score Martin Bussing DC Jr., et al., 2013) is: 9.9%    Plan:  1. Continue Trulicity 0.'75mg'$  weekly, no dose inc now consider in future for weight loss 2. Continue Metformin XR 500 BID - anticipate future will reduce dose or DC based on A1c and GLP1 therapy 3. Encourage improved lifestyle - low carb, low sugar diet, reduce portion size, continue improving emphasis on regular exercise 4. Continue to check CBG up to 1-2x daily 5. Continue ARB - future consider ASA, Statin 6. F/u as scheduled DM Eye Exam Dr Kerin Ransom - DM Foot Exam

## 2021-02-04 NOTE — Progress Notes (Signed)
Subjective:    Patient ID: Martin Sanders, male    DOB: 06/13/1970, 50 y.o.   MRN: [id number]  Martin Sanders is a 50 y.o. male presenting on 02/04/2021 for Diabetes   HPI  Here for Annual Physical and Lab Review.   FOLLOW-UP CHRONIC DM, Type 2 with Mild Peripheral Neuropathy // Morbid Obesity  He had nausea vomiting back on Trulicity 0.'75mg'$  weekly was taking without meal, then advised to take with meal and it resolved problem.  Controlled Diabetes in past, some fluctuation A1c Prior A1c 5.9 to 6.3 range Now today due A1c repeat Home CBG >120  He did have improvement on GLP1 therapy in past. And had wt loss and sugar control now not tolerating well. Had come off Ozempic.  Now remains on Trulicity 0.'75mg'$  weekly inj  He is active, walking with puppy CBGs: Last 30 days -CBG fluctuated some  Reports good compliance. Tolerating well w/o side-effects Currently on ARB Lifestyle: - Family history of obesity in family, no known diabetes in family, [place] heritage -Next exam DM Eye Dr Phineas Douglas - [facility] - 08/2020 (we did not receive report)  will request, he has done regular check up glasses but not DM eye exam  Denies polyuria, polydipsia, numbness tingling weakness   CHRONIC HTN: Reports no new concerns. He still checks BP outside office. Normal readings at home. Current Meds - Losartan '25mg'$  daily   Reports good compliance, took meds today. Tolerating well, w/o complaints. Denies CP, dyspnea, HA, edema, dizziness / lightheadedness      Health Maintenance:  Consider initial Colonoscopy in 1 year when [place] pandemic is reduced. He declines now, offered cologuard also wants to hold off on that test.   Shingrix vaccine due, will reconsider has printed copy of rx - due for. Day 0 and then repeat dose 2-6 months.    Depression screen [facility] 2/9 02/04/2021 02/05/2020 01/17/2019  Decreased Interest 0 0 0  Down, Depressed, Hopeless 0 0 0  PHQ - 2 Score 0 0 0  Altered  sleeping 0 - -  Tired, decreased energy 0 - -  Change in appetite 0 - -  Feeling bad or failure about yourself  0 - -  Trouble concentrating 0 - -  Moving slowly or fidgety/restless 0 - -  Suicidal thoughts 0 - -  PHQ-9 Score 0 - -  Difficult doing work/chores Not difficult at all - -    Social History   Tobacco Use   Smoking status: Never   Smokeless tobacco: Never  Substance Use Topics   Alcohol use: No    Comment: occasionally 2 glasses wine    Drug use: No    Review of Systems Per HPI unless specifically indicated above     Objective:    BP (!) 150/81   Pulse 71   Ht '6\' 1"'$  (1.854 m)   Wt (!) 350 lb (158.8 kg)   SpO2 96%   BMI 46.18 kg/m   Wt Readings from Last 3 Encounters:  02/04/21 (!) 350 lb (158.8 kg)  07/29/20 (!) 342 lb 8 oz (155.4 kg)  02/05/20 (!) 338 lb 9.6 oz (153.6 kg)    Physical Exam Vitals and nursing note reviewed.  Constitutional:      General: He is not in acute distress.    Appearance: Normal appearance. He is well-developed. He is obese. He is not diaphoretic.     Comments: Well-appearing, comfortable, cooperative  HENT:     Head: Normocephalic  and atraumatic.  Eyes:     General:        Right eye: No discharge.        Left eye: No discharge.     Conjunctiva/sclera: Conjunctivae normal.  Cardiovascular:     Rate and Rhythm: Normal rate.  Pulmonary:     Effort: Pulmonary effort is normal.  Skin:    General: Skin is warm and dry.     Findings: No erythema or rash.  Neurological:     Mental Status: He is alert and oriented to person, place, and time.  Psychiatric:        Mood and Affect: Mood normal.        Behavior: Behavior normal.        Thought Content: Thought content normal.     Comments: Well groomed, good eye contact, normal speech and thoughts    Diabetic Foot Exam - Simple   Simple Foot Form Diabetic Foot exam was performed with the following findings: Yes 02/04/2021  9:14 AM  Visual Inspection No deformities, no  ulcerations, no other skin breakdown bilaterally: Yes Sensation Testing Intact to touch and monofilament testing bilaterally: Yes Pulse Check Posterior Tibialis and Dorsalis pulse intact bilaterally: Yes Comments      Results for orders placed or performed in visit on 02/04/21  POCT HgB A1C  Result Value Ref Range   Hemoglobin A1C 4.2 4.0 - 5.6 %      Assessment & Plan:   Problem List Items Addressed This Visit     Controlled type 2 diabetes mellitus with diabetic neuropathy ([place]) - Primary    Dramatically improved A1c down to 4.2, question accuracy but seems consistent with lower CBG On GLP1 Trulicity now tolerating better less side effect taking w/ meal  No hypoglycemia or hyperglycemia Complication with mild peripheral neuropathy localized R foot only in past had similar symptoms The 10-year ASCVD risk score Mikey Bussing DC Jr., et al., 2013) is: 9.9%    Plan:  1. Continue Trulicity 0.'75mg'$  weekly, no dose inc now consider in future for weight loss 2. Continue Metformin XR 500 BID - anticipate future will reduce dose or DC based on A1c and GLP1 therapy 3. Encourage improved lifestyle - low carb, low sugar diet, reduce portion size, continue improving emphasis on regular exercise 4. Continue to check CBG up to 1-2x daily 5. Continue ARB - future consider ASA, Statin 6. F/u as scheduled DM Eye Exam Dr Kerin Ransom - DM Foot Exam      Relevant Medications   glucose blood (ACCU-CHEK GUIDE) test strip   Other Relevant Orders   POCT HgB A1C (Completed)      Meds ordered this encounter  Medications   glucose blood (ACCU-CHEK GUIDE) test strip    Sig: USE TO CHECK BLOOD SUGAR UP TO 2 X DAILY    Dispense:  200 strip    Refill:  3     Follow up plan: Return in about 6 months (around 08/04/2021) for 6 month fasting lab only then 1 week later Annual Physical.  Future labs ordered for 08/2021   [name], [facility] 02/04/2021, 9:02 AM

## 2021-02-09 ENCOUNTER — Other Ambulatory Visit: Payer: Self-pay | Admitting: Family Medicine

## 2021-02-09 DIAGNOSIS — E1140 Type 2 diabetes mellitus with diabetic neuropathy, unspecified: Secondary | ICD-10-CM

## 2021-02-10 NOTE — Telephone Encounter (Signed)
Requested Prescriptions  Pending Prescriptions Disp Refills  . TRULICITY [id number] SOPN [Pharmacy Med Name: TRULICITY [id number] ML PEN] 6 mL 1    Sig: INJECT 0.75 MG INTO THE SKIN ONCE A WEEK.     Endocrinology:  Diabetes - GLP-1 Receptor Agonists Passed - 02/09/2021  1:26 AM      Passed - HBA1C is between 0 and 7.9 and within 180 days    Hemoglobin A1C  Date Value Ref Range Status  02/04/2021 4.2 4.0 - 5.6 % Final   Hgb A1c MFr Bld  Date Value Ref Range Status  07/22/2020 6.3 (H) <5.7 % of total Hgb Final    Comment:    For someone without known diabetes, a hemoglobin  A1c value between 5.7% and 6.4% is consistent with prediabetes and should be confirmed with a  follow-up test. . For someone with known diabetes, a value <7% indicates that their diabetes is well controlled. A1c targets should be individualized based on duration of diabetes, age, comorbid conditions, and other considerations. . This assay result is consistent with an increased risk of diabetes. . Currently, no consensus exists regarding use of hemoglobin A1c for diagnosis of diabetes for children. Renella Cunas - Valid encounter within last 6 months    Recent Outpatient Visits          6 days ago Controlled type 2 diabetes mellitus with diabetic neuropathy, without long-term current use of insulin [facility])   [facility], Devonne Doughty, DO   6 months ago Annual physical exam   [facility] Olin Hauser, DO   1 year ago Controlled type 2 diabetes mellitus with diabetic neuropathy, without long-term current use of insulin [facility])   [place], DO   1 year ago Controlled type 2 diabetes mellitus with diabetic neuropathy, without long-term current use of insulin [facility])   [facility] Olin Hauser, DO   1 year ago Annual physical exam   [place], DO      Future Appointments            In 6 months [facility], Devonne Doughty, DO [facility], [facility]

## 2021-03-14 ENCOUNTER — Encounter: Payer: Self-pay | Admitting: Family Medicine

## 2021-04-12 ENCOUNTER — Other Ambulatory Visit: Payer: Self-pay | Admitting: Family Medicine

## 2021-04-12 DIAGNOSIS — E1140 Type 2 diabetes mellitus with diabetic neuropathy, unspecified: Secondary | ICD-10-CM

## 2021-04-12 DIAGNOSIS — I10 Essential (primary) hypertension: Secondary | ICD-10-CM

## 2021-04-12 NOTE — Telephone Encounter (Signed)
Requested medication (s) are due for refill today: yes  Requested medication (s) are on the active medication list: yes  Last refill:  01/15/21 #90  Future visit scheduled: yes  Notes to clinic:  overdue lab work   Requested Prescriptions  Pending Prescriptions Disp Refills   losartan (COZAAR) 25 MG tablet [Pharmacy Med Name: LOSARTAN POTASSIUM 25 MG TAB] 90 tablet 0    Sig: TAKE 1 TABLET BY MOUTH EVERY DAY     Cardiovascular:  Angiotensin Receptor Blockers Failed - 04/12/2021  8:56 AM      Failed - Cr in normal range and within 180 days    Creat  Date Value Ref Range Status  07/22/2020 0.72 0.70 - 1.33 mg/dL Final    Comment:    For patients >57 years of age, the reference limit for Creatinine is approximately 13% higher for people identified as African-American. .           Failed - K in normal range and within 180 days    Potassium  Date Value Ref Range Status  07/22/2020 4.2 3.5 - 5.3 mmol/L Final          Failed - Last BP in normal range    BP Readings from Last 1 Encounters:  02/04/21 (!) 150/81          Passed - Patient is not pregnant      Passed - Valid encounter within last 6 months    Recent Outpatient Visits           2 months ago Controlled type 2 diabetes mellitus with diabetic neuropathy, without long-term current use of insulin ([place])   [facility], Devonne Doughty, DO   8 months ago Annual physical exam   [facility] Olin Hauser, DO   1 year ago Controlled type 2 diabetes mellitus with diabetic neuropathy, without long-term current use of insulin [facility])   [place], DO   1 year ago Controlled type 2 diabetes mellitus with diabetic neuropathy, without long-term current use of insulin [facility])   [facility] Olin Hauser, DO   1 year ago Annual physical exam   [place], DO        Future Appointments             In 4 months [place] Ranger, Devonne Doughty, DO [facility], [facility]

## 2021-06-18 ENCOUNTER — Ambulatory Visit: Payer: BC Managed Care – PPO | Admitting: Family Medicine

## 2021-06-19 ENCOUNTER — Ambulatory Visit: Payer: BC Managed Care – PPO | Admitting: Family Medicine

## 2021-06-19 ENCOUNTER — Encounter: Payer: Self-pay | Admitting: Family Medicine

## 2021-06-19 ENCOUNTER — Other Ambulatory Visit: Payer: Self-pay

## 2021-06-19 VITALS — BP 128/76 | HR 92 | Ht 73.0 in | Wt 356.8 lb

## 2021-06-19 DIAGNOSIS — M545 Low back pain, unspecified: Secondary | ICD-10-CM

## 2021-06-19 MED ORDER — BACLOFEN 10 MG PO TABS
5.0000 mg | ORAL_TABLET | Freq: Three times a day (TID) | ORAL | 2 refills | Status: AC | PRN
Start: 2021-06-19 — End: ?

## 2021-06-19 NOTE — Patient Instructions (Addendum)
Thank you for coming to the office today.  Likely a back muscle strain at this time, unlikely to be kidney stone.  Ice is good for after activity  Heating pad / or moist heat good for stiffness, sore, pain and other symptom.  Start taking Baclofen (Lioresal) 10mg  (muscle relaxant) - start with half (cut) to one whole pill at night as needed for next 1-3 nights (may make you drowsy, caution with driving) see how it affects you, then if tolerated increase to one pill 2 to 3 times a day or (every 8 hours as needed)  Recommend to start taking Tylenol Extra Strength 500mg  tabs - take 1 to 2 tabs per dose (max 1000mg ) every 6-8 hours for pain (take regularly, don't skip a dose for next 7 days), max 24 hour daily dose is 6 tablets or 3000mg . In the future you can repeat the same everyday Tylenol course for 1-2 weeks at a time.  - This is safe to take with anti-inflammatory medicines (Ibuprofen, Advil, Naproxen, Aleve, Meloxicam, Mobic)  START anti inflammatory topical - OTC Voltaren (generic Diclofenac) topical 2-4 times a day as needed for pain swelling of affected joint for 1-2 weeks or longer.   Please schedule a Follow-up Appointment to: Return if symptoms worsen or fail to improve.  If you have any other questions or concerns, please feel free to call the office or send a message through [place]. You may also schedule an earlier appointment if necessary.  Additionally, you may be receiving a survey about your experience at our office within a few days to 1 week by e-mail or mail. We value your feedback.  [name], DO [place]

## 2021-06-19 NOTE — Progress Notes (Signed)
Subjective:    Patient ID: Martin Sanders, male    DOB: 1971-01-21, 51 y.o.   MRN: [id number]  Martin Sanders is a 51 y.o. male presenting on 06/19/2021 for Flank Pain   HPI  Right Low Back vs Flank Pain Reports symptoms started R sided small of back low back / flank with some initial pain, localized, radiating from 5 to 10, severe 7-10 flares, Walking does worsen the pain, and moving or twisting.  Admits blood sugar higher for past 2 weeks. Urine can be darker yellow now. Admits increased urinary frequency.  Heating pad works on his back and it does help.  PMHx Nephrolithiasis  Denies any dysuria, hematuria   Depression screen [facility] 2/9 06/19/2021 02/04/2021 02/05/2020  Decreased Interest 0 0 0  Down, Depressed, Hopeless 0 0 0  PHQ - 2 Score 0 0 0  Altered sleeping 0 0 -  Tired, decreased energy 0 0 -  Change in appetite 0 0 -  Feeling bad or failure about yourself  0 0 -  Trouble concentrating 0 0 -  Moving slowly or fidgety/restless 0 0 -  Suicidal thoughts 0 0 -  PHQ-9 Score 0 0 -  Difficult doing work/chores Not difficult at all Not difficult at all -    Social History   Tobacco Use   Smoking status: Never   Smokeless tobacco: Never  Substance Use Topics   Alcohol use: No    Comment: occasionally 2 glasses wine    Drug use: No    Review of Systems Per HPI unless specifically indicated above     Objective:    BP 128/76    Pulse 92    Ht 6\' 1"  (1.854 m)    Wt (!) 356 lb 12.8 oz (161.8 kg)    SpO2 98%    BMI 47.07 kg/m   Wt Readings from Last 3 Encounters:  06/19/21 (!) 356 lb 12.8 oz (161.8 kg)  02/04/21 (!) 350 lb (158.8 kg)  07/29/20 (!) 342 lb 8 oz (155.4 kg)    Physical Exam Vitals and nursing note reviewed.  Constitutional:      General: He is not in acute distress.    Appearance: He is well-developed. He is obese. He is not diaphoretic.     Comments: Well-appearing, comfortable, cooperative  HENT:     Head: Normocephalic and atraumatic.  Eyes:      General:        Right eye: No discharge.        Left eye: No discharge.     Conjunctiva/sclera: Conjunctivae normal.  Neck:     Thyroid: No thyromegaly.  Cardiovascular:     Rate and Rhythm: Normal rate.     Pulses: Normal pulses.  Pulmonary:     Effort: Pulmonary effort is normal. No respiratory distress.     Breath sounds: Normal breath sounds. No wheezing or rales.  Musculoskeletal:        General: Normal range of motion.     Cervical back: Normal range of motion and neck supple.     Comments: Low Back Inspection: Normal appearance, Large body habitus, no spinal deformity, symmetrical. Palpation: No tenderness over spinous processes. No provoked tenderness on low back R side, no paraspinal hypertonicity ROM: Full active ROM forward flex / back extension, rotation L/R without discomfort Special Testing: Seated SLR negative for radicular pain bilaterally  Strength: Bilateral hip flex/ext 5/5, knee flex/ext 5/5, ankle dorsiflex/plantarflex 5/5 Neurovascular: intact distal sensation to light touch  Lymphadenopathy:     Cervical: No cervical adenopathy.  Skin:    General: Skin is warm and dry.     Findings: No erythema or rash.  Neurological:     Mental Status: He is alert and oriented to person, place, and time. Mental status is at baseline.  Psychiatric:        Behavior: Behavior normal.     Comments: Well groomed, good eye contact, normal speech and thoughts   Results for orders placed or performed in visit on 02/04/21  POCT HgB A1C  Result Value Ref Range   Hemoglobin A1C 4.2 4.0 - 5.6 %      Assessment & Plan:   Problem List Items Addressed This Visit   None Visit Diagnoses     Acute right-sided low back pain without sciatica    -  Primary   Relevant Medications   baclofen (LIORESAL) 10 MG tablet       Acute L LBP without associated sciatica. Suspect likely due to muscle spasm/strain, without known injury or trauma.  Symptoms reproducible with movement and  position change. No chronic back pain Hx Nephrolithiasis, but symptoms not characteristic today - Inadequate conservative therapy   Plan: 1. Start muscle relaxant with Baclofen 10mg  tabs - take 5-10mg  up to TID PRN, titrate up as tolerated 2. Tylenol PRN 3. Topical voltaren PRN, limited use on back but could trial 4. Heat / ice PRN  Follow-up 4-6 weeks if not improved for re-evaluation, consider X-ray imaging, trial of PT, and possibly referral to Orthopedic  Reconsider kidney stone if new symptoms, but currently not indicated based on presentation.   Meds ordered this encounter  Medications   baclofen (LIORESAL) 10 MG tablet    Sig: Take 0.5-1 tablets (5-10 mg total) by mouth 3 (three) times daily as needed for muscle spasms.    Dispense:  30 each    Refill:  2      Follow up plan: Return if symptoms worsen or fail to improve.   Martin Sanders, [facility] 06/19/2021, 1:57 PM

## 2021-07-01 ENCOUNTER — Other Ambulatory Visit: Payer: Self-pay | Admitting: Family Medicine

## 2021-07-01 DIAGNOSIS — E1140 Type 2 diabetes mellitus with diabetic neuropathy, unspecified: Secondary | ICD-10-CM

## 2021-07-01 MED ORDER — METFORMIN HCL ER 500 MG PO TB24: 500.0000 mg | ORAL_TABLET | Freq: Two times a day (BID) | ORAL | 0 refills | Status: DC

## 2021-07-01 NOTE — Telephone Encounter (Signed)
Requested Prescriptions  Pending Prescriptions Disp Refills   metFORMIN (GLUCOPHAGE-XR) 500 MG 24 hr tablet [Pharmacy Med Name: METFORMIN HCL ER 500 MG TABLET] 180 tablet 0    Sig: TAKE 1 TABLET BY MOUTH TWICE A DAY     Endocrinology:  Diabetes - Biguanides Passed - 07/01/2021  6:54 AM      Passed - Cr in normal range and within 360 days    Creat  Date Value Ref Range Status  07/22/2020 0.72 0.70 - 1.33 mg/dL Final    Comment:    For patients >42 years of age, the reference limit for Creatinine is approximately 13% higher for people identified as African-American. .          Passed - HBA1C is between 0 and 7.9 and within 180 days    Hemoglobin A1C  Date Value Ref Range Status  02/04/2021 4.2 4.0 - 5.6 % Final   Hgb A1c MFr Bld  Date Value Ref Range Status  07/22/2020 6.3 (H) <5.7 % of total Hgb Final    Comment:    For someone without known diabetes, a hemoglobin  A1c value between 5.7% and 6.4% is consistent with prediabetes and should be confirmed with a  follow-up test. . For someone with known diabetes, a value <7% indicates that their diabetes is well controlled. A1c targets should be individualized based on duration of diabetes, age, comorbid conditions, and other considerations. . This assay result is consistent with an increased risk of diabetes. . Currently, no consensus exists regarding use of hemoglobin A1c for diagnosis of diabetes for children. .          Passed - eGFR in normal range and within 360 days    GFR, Est African American  Date Value Ref Range Status  07/22/2020 124 > OR = 60 mL/min/1.37m Final   GFR, Est Non African American  Date Value Ref Range Status  07/22/2020 107 > OR = 60 mL/min/1.743mFinal         Passed - Valid encounter within last 6 months    Recent Outpatient Visits          1 week ago Acute right-sided low back pain without sciatica   [place]   4 months ago  Controlled type 2 diabetes mellitus with diabetic neuropathy, without long-term current use of insulin [facility]   11 months ago Annual physical exam   [facility]   1 year ago Controlled type 2 diabetes mellitus with diabetic neuropathy, without long-term current use of insulin [facility]  [facility]   1 year ago Controlled type 2 diabetes mellitus with diabetic neuropathy, without long-term current use of insulin ([place]  [facility] DoughtyDO      Future Appointments            In 1 month KaParks RangerAlDevonne DoughtyDO [facility]

## 2021-07-27 ENCOUNTER — Other Ambulatory Visit: Payer: Self-pay | Admitting: Family Medicine

## 2021-07-27 DIAGNOSIS — E1140 Type 2 diabetes mellitus with diabetic neuropathy, unspecified: Secondary | ICD-10-CM

## 2021-07-28 NOTE — Telephone Encounter (Signed)
Requested Prescriptions  Pending Prescriptions Disp Refills   TRULICITY 8.93 YB/0.1BP SOPN [Pharmacy Med Name: TRULICITY 1.02 HE/5.2 ML PEN] 6 mL 1    Sig: INJECT 0.75 MG INTO THE SKIN ONCE A WEEK.     Endocrinology:  Diabetes - GLP-1 Receptor Agonists Passed - 07/27/2021  1:39 AM      Passed - HBA1C is between 0 and 7.9 and within 180 days    Hemoglobin A1C  Date Value Ref Range Status  02/04/2021 4.2 4.0 - 5.6 % Final   Hgb A1c MFr Bld  Date Value Ref Range Status  07/22/2020 6.3 (H) <5.7 % of total Hgb Final    Comment:    For someone without known diabetes, a hemoglobin  A1c value between 5.7% and 6.4% is consistent with prediabetes and should be confirmed with a  follow-up test. . For someone with known diabetes, a value <7% indicates that their diabetes is well controlled. A1c targets should be individualized based on duration of diabetes, age, comorbid conditions, and other considerations. . This assay result is consistent with an increased risk of diabetes. . Currently, no consensus exists regarding use of hemoglobin A1c for diagnosis of diabetes for children. Renella Cunas - Valid encounter within last 6 months    Recent Outpatient Visits          1 month ago Acute right-sided low back pain without sciatica   [place], DO   5 months ago Controlled type 2 diabetes mellitus with diabetic neuropathy, without long-term current use of insulin [facility])   [facility] Olin Hauser, DO   12 months ago Annual physical exam   [facility] Olin Hauser, DO   1 year ago Controlled type 2 diabetes mellitus with diabetic neuropathy, without long-term current use of insulin [facility])   [place], DO   1 year ago Controlled type 2 diabetes mellitus with diabetic neuropathy, without long-term current use of insulin ([place])   [facility] Parks Ranger, Devonne Doughty, DO      Future Appointments            In 3 weeks Parks Ranger, Devonne Doughty, DO [facility]

## 2021-08-10 ENCOUNTER — Other Ambulatory Visit: Payer: Self-pay

## 2021-08-10 DIAGNOSIS — Z6841 Body Mass Index (BMI) 40.0 and over, adult: Secondary | ICD-10-CM

## 2021-08-10 DIAGNOSIS — E1169 Type 2 diabetes mellitus with other specified complication: Secondary | ICD-10-CM

## 2021-08-10 DIAGNOSIS — Z0000 Encounter for general adult medical examination without abnormal findings: Secondary | ICD-10-CM

## 2021-08-10 DIAGNOSIS — Z125 Encounter for screening for malignant neoplasm of prostate: Secondary | ICD-10-CM

## 2021-08-10 DIAGNOSIS — I10 Essential (primary) hypertension: Secondary | ICD-10-CM

## 2021-08-10 DIAGNOSIS — E1140 Type 2 diabetes mellitus with diabetic neuropathy, unspecified: Secondary | ICD-10-CM

## 2021-08-11 ENCOUNTER — Other Ambulatory Visit: Payer: Self-pay

## 2021-08-11 ENCOUNTER — Other Ambulatory Visit: Payer: BC Managed Care – PPO

## 2021-08-12 LAB — CBC WITH DIFFERENTIAL/PLATELET
Absolute Monocytes: 525 cells/uL (ref 200–950)
Basophils Absolute: 63 cells/uL (ref 0–200)
Basophils Relative: 0.9 %
Eosinophils Absolute: 273 cells/uL (ref 15–500)
Eosinophils Relative: 3.9 %
HCT: 50.5 % — ABNORMAL HIGH (ref 38.5–50.0)
Hemoglobin: 16.5 g/dL (ref 13.2–17.1)
Lymphs Abs: 1309 cells/uL (ref 850–3900)
MCH: 27.8 pg (ref 27.0–33.0)
MCHC: 32.7 g/dL (ref 32.0–36.0)
MCV: 85.2 fL (ref 80.0–100.0)
MPV: 9.8 fL (ref 7.5–12.5)
Monocytes Relative: 7.5 %
Neutro Abs: 4830 cells/uL (ref 1500–7800)
Neutrophils Relative %: 69 %
Platelets: 253 10*3/uL (ref 140–400)
RBC: 5.93 10*6/uL — ABNORMAL HIGH (ref 4.20–5.80)
RDW: 14.1 % (ref 11.0–15.0)
Total Lymphocyte: 18.7 %
WBC: 7 10*3/uL (ref 3.8–10.8)

## 2021-08-12 LAB — LIPID PANEL
Cholesterol: 145 mg/dL (ref <200)
HDL: 43 mg/dL (ref >=40)
LDL Cholesterol (Calc): 73 mg/dL (calc)
Non-HDL Cholesterol (Calc): 102 mg/dL (calc) (ref <130)
Total CHOL/HDL Ratio: 3.4 (calc) (ref <5.0)
Triglycerides: 191 mg/dL — ABNORMAL HIGH (ref <150)

## 2021-08-12 LAB — COMPLETE METABOLIC PANEL WITH GFR
AG Ratio: 1.7 (calc) (ref 1.0–2.5)
ALT: 24 U/L (ref 9–46)
AST: 15 U/L (ref 10–35)
Albumin: 4 g/dL (ref 3.6–5.1)
Alkaline phosphatase (APISO): 74 U/L (ref 35–144)
BUN: 14 mg/dL (ref 7–25)
CO2: 25 mmol/L (ref 20–32)
Calcium: 9.2 mg/dL (ref 8.6–10.3)
Chloride: 105 mmol/L (ref 98–110)
Creat: 0.88 mg/dL (ref 0.70–1.30)
Globulin: 2.4 g/dL (calc) (ref 1.9–3.7)
Glucose, Bld: 140 mg/dL — ABNORMAL HIGH (ref 65–139)
Potassium: 4.8 mmol/L (ref 3.5–5.3)
Sodium: 139 mmol/L (ref 135–146)
Total Bilirubin: 0.3 mg/dL (ref 0.2–1.2)
Total Protein: 6.4 g/dL (ref 6.1–8.1)
eGFR: 103 mL/min/{1.73_m2} (ref >=60)

## 2021-08-12 LAB — TSH: TSH: 3.2 mIU/L (ref 0.40–4.50)

## 2021-08-12 LAB — PSA: PSA: 0.49 ng/mL (ref <=4.00)

## 2021-08-12 LAB — HEMOGLOBIN A1C
Hgb A1c MFr Bld: 6.8 % of total Hgb — ABNORMAL HIGH (ref <5.7)
Mean Plasma Glucose: 148 mg/dL
eAG (mmol/L): 8.2 mmol/L

## 2021-08-18 ENCOUNTER — Ambulatory Visit (INDEPENDENT_AMBULATORY_CARE_PROVIDER_SITE_OTHER): Payer: BC Managed Care – PPO | Admitting: Family Medicine

## 2021-08-18 ENCOUNTER — Other Ambulatory Visit: Payer: Self-pay

## 2021-08-18 ENCOUNTER — Encounter: Payer: Self-pay | Admitting: Family Medicine

## 2021-08-18 VITALS — BP 138/80 | HR 88 | Ht 73.0 in | Wt 355.0 lb

## 2021-08-18 DIAGNOSIS — Z0000 Encounter for general adult medical examination without abnormal findings: Secondary | ICD-10-CM

## 2021-08-18 DIAGNOSIS — E1169 Type 2 diabetes mellitus with other specified complication: Secondary | ICD-10-CM

## 2021-08-18 DIAGNOSIS — E785 Hyperlipidemia, unspecified: Secondary | ICD-10-CM

## 2021-08-18 DIAGNOSIS — E1140 Type 2 diabetes mellitus with diabetic neuropathy, unspecified: Secondary | ICD-10-CM

## 2021-08-18 DIAGNOSIS — Z6841 Body Mass Index (BMI) 40.0 and over, adult: Secondary | ICD-10-CM

## 2021-08-18 DIAGNOSIS — I10 Essential (primary) hypertension: Secondary | ICD-10-CM

## 2021-08-18 DIAGNOSIS — Z23 Encounter for immunization: Secondary | ICD-10-CM

## 2021-08-18 DIAGNOSIS — N529 Male erectile dysfunction, unspecified: Secondary | ICD-10-CM

## 2021-08-18 MED ORDER — SILDENAFIL CITRATE 20 MG PO TABS: 80.0000 mg | ORAL_TABLET | ORAL | 6 refills | Status: DC | PRN

## 2021-08-18 MED ORDER — SHINGRIX 50 MCG/0.5ML IM SUSR: INTRAMUSCULAR | 1 refills | Status: AC

## 2021-08-18 NOTE — Assessment & Plan Note (Signed)
Well-controlled HTN ?- Home BP readings normal  ?No known complications  ?  ? ?Plan:  ?1. Continue current BP regimen Losartan '25mg'$  daily ?2. Encourage improved lifestyle - low sodium diet, regular exercise ?3. Continue monitor BP outside office, bring readings to next visit, if persistently >140/90 or new symptoms notify office sooner ?

## 2021-08-18 NOTE — Progress Notes (Signed)
? ?Subjective:  ? ? Patient ID: Martin Sanders, male    DOB: 10-31-1970, 51 y.o.   MRN: [id number] ? ?Martin Sanders is a 51 y.o. male presenting on 08/18/2021 for Annual Exam ? ? ?HPI ? ?Here for Annual Physical and Lab Review. ?  ?FOLLOW-UP CHRONIC DM, Type 2 with Mild Peripheral Neuropathy // Morbid Obesity  ?Controlled Diabetes in past, some fluctuation A1c ?Prior A1c 5.9 to 7 ?Recent A1c 6.8 (08/2021) ?Home CBG >137 avg, based in past quarter following previous difficulty with wife in hospital for gallbladder had limited ability to adhere to regimen. ?  ?He did have improvement on GLP1 therapy in past. And had wt loss and sugar control now not tolerating well. Had come off Ozempic. ? ?Continues Trulicity 0.08QP weekly inj if on full stomach ?Continues Metformin XR 531m BID (occasional 3 times a day dosing) ? He is active, walking with puppy  ?Reports good compliance. Tolerating well w/o side-effects ?Currently on ARB ?Lifestyle: ?- Family history of obesity in family, no known diabetes in family, [name] ?-[facility] ? will request, he has done regular check up glasses but not DM eye exam ?  ?Denies polyuria, polydipsia, numbness tingling weakness ?  ?CHRONIC HTN: ?Reports no new concerns. He still checks BP outside office. Normal readings at home. ?Current Meds - Losartan 227mdaily   ?Reports good compliance, took meds today. Tolerating well, w/o complaints. ?Denies CP, dyspnea, HA, edema, dizziness / lightheadedness ?  ?   ?Health Maintenance:  ?Consider initial Colonoscopy in 1 year when C[place]andemic is reduced. He declines now, offered cologuard also wants to hold off on that test. ? ?Colonoscopy - upcoming after April 2023. He will notify. ?  ?Shingrix vaccine due, will reconsider has printed copy of rx - due for. Day 0 and then repeat dose 2-6 months. ? ?PSA 0.49 (08/2021) ? ?[facility] 08/18/2021 06/19/2021 02/04/2021  ?Decreased  Interest 0 0 0  ?Down, Depressed, Hopeless 0 0 0  ?PHQ - 2 Score 0 0 0  ?Altered sleeping 0 0 0  ?Tired, decreased energy 0 0 0  ?Change in appetite 0 0 0  ?Feeling bad or failure about yourself  0 0 0  ?Trouble concentrating 0 0 0  ?Moving slowly or fidgety/restless 0 0 0  ?Suicidal thoughts 0 0 0  ?PHQ-9 Score 0 0 0  ?Difficult doing work/chores Not difficult at all Not difficult at all Not difficult at all  ? ? ?History reviewed. No pertinent past medical history. ?Past Surgical History:  ?Procedure Laterality Date  ? KNEE ARTHROSCOPY AND ARTHROTOMY    ? ?Social History  ? ?Socioeconomic History  ? Marital status: Married  ?  Spouse name: Not on file  ? Number of children: Not on file  ? Years of education: Not on file  ? Highest education level: Not on file  ?Occupational History  ? Not on file  ?Tobacco Use  ? Smoking status: Never  ? Smokeless tobacco: Never  ?Substance and Sexual Activity  ? Alcohol use: No  ?  Comment: occasionally 2 glasses wine   ? Drug use: No  ? Sexual activity: Not on file  ?Other Topics Concern  ? Not on file  ?Social History Narrative  ? Not on file  ? ?Social Determinants of Health  ? ?Financial Resource Strain: Not on file  ?Food Insecurity: Not on file  ?Transportation Needs: Not  on file  ?Physical Activity: Not on file  ?Stress: Not on file  ?Social Connections: Not on file  ?Intimate Partner Violence: Not on file  ? ?History reviewed. No pertinent family history. ?Current Outpatient Medications on File Prior to Visit  ?Medication Sig  ? Accu-Chek FastClix Lancets MISC Check sugar up to 2 x daily  ? Alpha-Lipoic Acid 100 MG CAPS Take 1 capsule (100 mg total) by mouth daily.  ? baclofen (LIORESAL) 10 MG tablet Take 0.5-1 tablets (5-10 mg total) by mouth 3 (three) times daily as needed for muscle spasms.  ? Blood Glucose Monitoring Suppl (CONTOUR NEXT EZ MONITOR) w/Device KIT 1 each by Does not apply route 2 (two) times daily. Dx E11.9  ? cetirizine-pseudoephedrine (ZYRTEC-D) 5-120  MG tablet Take by mouth.  ? Cholecalciferol (VITAMIN D3) 125 MCG (5000 UT) CAPS Take 5,000 Units by mouth once a week.  ? glucose blood (ACCU-CHEK GUIDE) test strip USE TO CHECK BLOOD SUGAR UP TO 2 X DAILY  ? losartan (COZAAR) 25 MG tablet TAKE 1 TABLET BY MOUTH EVERY DAY  ? metFORMIN (GLUCOPHAGE-XR) 500 MG 24 hr tablet Take 1 tablet (500 mg total) by mouth 2 (two) times daily.  ? mupirocin ointment (BACTROBAN) 2 % Apply 1 application topically 2 (two) times daily. Up to 1-2 weeks as needed.  ? TRULICITY 2.37 SE/8.3TD SOPN INJECT 0.75 MG INTO THE SKIN ONCE A WEEK.  ? ?No current facility-administered medications on file prior to visit.  ? ? ?Review of Systems  ?Constitutional:  Negative for activity change, appetite change, chills, diaphoresis, fatigue and fever.  ?HENT:  Negative for congestion and hearing loss.   ?Eyes:  Negative for visual disturbance.  ?Respiratory:  Negative for cough, chest tightness, shortness of breath and wheezing.   ?Cardiovascular:  Negative for chest pain, palpitations and leg swelling.  ?Gastrointestinal:  Negative for abdominal pain, constipation, diarrhea, nausea and vomiting.  ?Genitourinary:  Negative for dysuria, frequency and hematuria.  ?Musculoskeletal:  Negative for arthralgias and neck pain.  ?Skin:  Negative for rash.  ?Neurological:  Negative for dizziness, weakness, light-headedness, numbness and headaches.  ?Hematological:  Negative for adenopathy.  ?Psychiatric/Behavioral:  Negative for behavioral problems, dysphoric mood and sleep disturbance.   ?Per HPI unless specifically indicated above ? ? ?   ?Objective:  ?  ?BP 138/80 (BP Location: Left Arm, Cuff Size: Normal)   Pulse 88   Ht _0  (1.854 m)   Wt (!) 355 lb (161 kg)   SpO2 99%   BMI 46.84 kg/m?   ?Wt Readings from Last 3 Encounters:  ?08/18/21 (!) 355 lb (161 kg)  ?06/19/21 (!) 356 lb 12.8 oz (161.8 kg)  ?02/04/21 (!) 350 lb (158.8 kg)  ?  ?Physical Exam ?Vitals and nursing note reviewed.  ?Constitutional:   ?    General: He is not in acute distress. ?   Appearance: He is well-developed. He is obese. He is not diaphoretic.  ?   Comments: Well-appearing, comfortable, cooperative  ?HENT:  ?   Head: Normocephalic and atraumatic.  ?Eyes:  ?   General:     ?   Right eye: No discharge.     ?   Left eye: No discharge.  ?   Conjunctiva/sclera: Conjunctivae normal.  ?   Pupils: Pupils are equal, round, and reactive to light.  ?Neck:  ?   Thyroid: No thyromegaly.  ?   Vascular: No carotid bruit.  ?Cardiovascular:  ?   Rate and Rhythm: Normal rate and regular  rhythm.  ?   Pulses: Normal pulses.  ?   Heart sounds: Normal heart sounds. No murmur heard. ?Pulmonary:  ?   Effort: Pulmonary effort is normal. No respiratory distress.  ?   Breath sounds: Normal breath sounds. No wheezing or rales.  ?Abdominal:  ?   General: Bowel sounds are normal. There is no distension.  ?   Palpations: Abdomen is soft. There is no mass.  ?   Tenderness: There is no abdominal tenderness.  ?Musculoskeletal:     ?   General: No tenderness. Normal range of motion.  ?   Cervical back: Normal range of motion and neck supple.  ?   Right lower leg: No edema.  ?   Left lower leg: No edema.  ?   Comments: Upper / Lower Extremities: ?- Normal muscle tone, strength bilateral upper extremities 5/5, lower extremities 5/5  ?Lymphadenopathy:  ?   Cervical: No cervical adenopathy.  ?Skin: ?   General: Skin is warm and dry.  ?   Findings: No erythema or rash.  ?Neurological:  ?   Mental Status: He is alert and oriented to person, place, and time.  ?   Comments: Distal sensation intact to light touch all extremities  ?Psychiatric:     ?   Mood and Affect: Mood normal.     ?   Behavior: Behavior normal.     ?   Thought Content: Thought content normal.  ?   Comments: Well groomed, good eye contact, normal speech and thoughts  ? ?Results for orders placed or performed in visit on 08/10/21  ?TSH  ?Result Value Ref Range  ? TSH 3.20 0.40 - 4.50 mIU/L  ?PSA  ?Result Value Ref  Range  ? PSA 0.49 < OR = 4.00 ng/mL  ?Hemoglobin A1c  ?Result Value Ref Range  ? Hgb A1c MFr Bld 6.8 (H) <5.7 % of total Hgb  ? Mean Plasma Glucose 148 mg/dL  ? eAG (mmol/L) 8.2 mmol/L  ?CBC with Differential/P

## 2021-08-18 NOTE — Assessment & Plan Note (Signed)
A1c up to 6.8, with some limited dietary adherence due to wife's illness / hospital ?On GLP1 Trulicity now tolerating better less side effect taking w/ meal ?No hypoglycemia or hyperglycemia ?Complication with mild peripheral neuropathy localized R foot only in past had similar symptoms ?The 10-year ASCVD risk score (Arnett DK, et al., 2019) is: 9.5%  ? ? ?Plan:  ?1. Continue Trulicity 0.'75mg'$  weekly ?2. Continue Metformin XR 500 BID ?3. Encourage improved lifestyle - low carb, low sugar diet, reduce portion size, continue improving emphasis on regular exercise ?4. Continue to check CBG up to 1-2x daily ?5. Continue ARB - future consider ASA, Statin ?6. F/u as scheduled DM Eye Exam Dr Kerin Ransom ? ?

## 2021-08-18 NOTE — Patient Instructions (Addendum)
Thank you for coming to the office today. ? ?Keep on current plan. Keep improving lifestyle as discussed. ?Continue Trulicity ?BP improved on re-check ?Let me know if need refills ? ?Please schedule a Follow-up Appointment to: Return in about 6 months (around 02/18/2022) for 6 month DM A1c, Wt, HTN. ? ?If you have any other questions or concerns, please feel free to call the office or send a message through [place]. You may also schedule an earlier appointment if necessary. ? ?Additionally, you may be receiving a survey about your experience at our office within a few days to 1 week by e-mail or mail. We value your feedback. ? ?[name], DO ?[place] ?

## 2021-08-18 NOTE — Assessment & Plan Note (Signed)
Weight +5 lbs in 6 months ?Encourage continue lifestyle diet modification ?Continue GLP1 therapy ?

## 2021-08-18 NOTE — Assessment & Plan Note (Signed)
Controlled cholesterol not on statin ?Last lipid panel 2023 ? ?Plan: ?1. Future may warrant statin therapy / ASA for ASCVD risk reduction ?2. Encourage improved lifestyle - low carb/cholesterol, reduce portion size, continue improving regular exercise ?

## 2021-10-19 ENCOUNTER — Ambulatory Visit: Payer: Self-pay

## 2021-10-19 NOTE — Telephone Encounter (Signed)
?  Chief Complaint: sour burps, diarrhea ?Symptoms: sour burps, dry tongue,watery severe diarrhea ?Frequency: since yesterday ?Pertinent Negatives: Patient denies fever or blood in stool ?Disposition: '[]'$ ED /'[]'$ Urgent Care (no appt availability in office) / '[]'$ Appointment(In office/virtual)/ '[]'$  [facility]/ '[x]'$ Home Care/ '[]'$ Refused Recommended Disposition /'[]'$ [facility]/ '[]'$  Follow-up with PCP ?Additional Notes: also advised 1/2 teaspoon baking sods in 4 ounces warm water. ? ? ? ? ? ? ? ? ? ? ? ? ? ?Reason for Disposition ? SEVERE diarrhea (e.g., 7 or more times / day more than normal) ? ?Answer Assessment - Initial Assessment Questions ?1. DIARRHEA SEVERITY: "How bad is the diarrhea?" "How many more stools have you had in the past 24 hours than normal?"  ?  - NO DIARRHEA (SCALE 0) ?  - MILD (SCALE 1-3): Few loose or mushy BMs; increase of 1-3 stools over normal daily number of stools; mild increase in ostomy output. ?  -  MODERATE (SCALE 4-7): Increase of 4-6 stools daily over normal; moderate increase in ostomy output. ?* SEVERE (SCALE 8-10; OR 'WORST POSSIBLE'): Increase of 7 or more stools daily over normal; moderate increase in ostomy output; incontinence. ?    severe ?2. ONSET: "When did the diarrhea begin?"  ?    Last night ?3. BM CONSISTENCY: "How loose or watery is the diarrhea?"  ?    watery ?4. VOMITING: "Are you also vomiting?" If Yes, ask: "How many times in the past 24 hours?"  ?    no ?5. ABDOMINAL PAIN: "Are you having any abdominal pain?" If Yes, ask: "What does it feel like?" (e.g., crampy, dull, intermittent, constant)  ?    no ?6. ABDOMINAL PAIN SEVERITY: If present, ask: "How bad is the pain?"  (e.g., Scale 1-10; mild, moderate, or severe) ?  - MILD (1-3): doesn't interfere with normal activities, abdomen soft and not tender to touch  ?  - MODERATE (4-7): interferes with normal activities or awakens from sleep, abdomen tender to touch  ?  - SEVERE (8-10): excruciating pain,  doubled over, unable to do any normal activities   ?    Mils from attempting to vomit ?7. ORAL INTAKE: If vomiting, "Have you been able to drink liquids?" "How much liquids have you had in the past 24 hours?" ?  20 7 up ?8. HYDRATION: "Any signs of dehydration?" (e.g., dry mouth [not just dry lips], too weak to stand, dizziness, new weight loss) "When did you last urinate?" ?    No- 0830 ?9. EXPOSURE: "Have you traveled to a foreign country recently?" "Have you been exposed to anyone with diarrhea?" "Could you have eaten any food that was spoiled?" ?    N/a ?10. ANTIBIOTIC USE: "Are you taking antibiotics now or have you taken antibiotics in the past 2 months?" ?      no ?11. OTHER SYMPTOMS: "Do you have any other symptoms?" (e.g., fever, blood in stool) ?      Sour burps ?12. PREGNANCY: "Is there any chance you are pregnant?" "When was your last menstrual period?" ?      N/a ? ?Protocols used: Diarrhea-A-AH ? ?

## 2021-12-10 ENCOUNTER — Other Ambulatory Visit: Payer: Self-pay | Admitting: Family Medicine

## 2021-12-10 DIAGNOSIS — E1140 Type 2 diabetes mellitus with diabetic neuropathy, unspecified: Secondary | ICD-10-CM

## 2021-12-10 MED ORDER — METFORMIN HCL ER 500 MG PO TB24: 500.0000 mg | ORAL_TABLET | Freq: Two times a day (BID) | ORAL | 0 refills | Status: DC

## 2022-01-12 ENCOUNTER — Other Ambulatory Visit: Payer: Self-pay | Admitting: Family Medicine

## 2022-01-12 DIAGNOSIS — E1140 Type 2 diabetes mellitus with diabetic neuropathy, unspecified: Secondary | ICD-10-CM

## 2022-01-12 NOTE — Telephone Encounter (Signed)
Requested Prescriptions  Pending Prescriptions Disp Refills  . TRULICITY 9.67 RF/1.6BW SOPN [Pharmacy Med Name: TRULICITY 4.66 ZL/9.3 ML PEN] 6 mL 1    Sig: INJECT 0.75 MG INTO THE SKIN ONCE A WEEK.     Endocrinology:  Diabetes - GLP-1 Receptor Agonists Passed - 01/12/2022  2:20 AM      Passed - HBA1C is between 0 and 7.9 and within 180 days    Hgb A1c MFr Bld  Date Value Ref Range Status  08/11/2021 6.8 (H) <5.7 % of total Hgb Final    Comment:    For someone without known diabetes, a hemoglobin A1c value of 6.5% or greater indicates that they may have  diabetes and this should be confirmed with a follow-up  test. . For someone with known diabetes, a value <7% indicates  that their diabetes is well controlled and a value  greater than or equal to 7% indicates suboptimal  control. A1c targets should be individualized based on  duration of diabetes, age, comorbid conditions, and  other considerations. . Currently, no consensus exists regarding use of hemoglobin A1c for diagnosis of diabetes for children. Renella Cunas - Valid encounter within last 6 months    Recent Outpatient Visits          4 months ago Annual physical exam   [place], DO   6 months ago Acute right-sided low back pain without sciatica   [place], DO   11 months ago Controlled type 2 diabetes mellitus with diabetic neuropathy, without long-term current use of insulin [facility])   [place], DO   1 year ago Annual physical exam   [facility] Olin Hauser, DO   1 year ago Controlled type 2 diabetes mellitus with diabetic neuropathy, without long-term current use of insulin [facility])   [facility] Parks Ranger, Devonne Doughty, DO      Future Appointments            In 1 month Parks Ranger, Devonne Doughty, DO [facility], [facility]

## 2022-02-12 ENCOUNTER — Other Ambulatory Visit: Payer: Self-pay | Admitting: Family Medicine

## 2022-02-12 DIAGNOSIS — E1140 Type 2 diabetes mellitus with diabetic neuropathy, unspecified: Secondary | ICD-10-CM

## 2022-02-12 MED ORDER — METFORMIN HCL ER 500 MG PO TB24: 500.0000 mg | ORAL_TABLET | Freq: Two times a day (BID) | ORAL | 0 refills | Status: DC

## 2022-02-22 ENCOUNTER — Ambulatory Visit: Payer: BC Managed Care – PPO | Admitting: Family Medicine

## 2022-02-24 ENCOUNTER — Ambulatory Visit (INDEPENDENT_AMBULATORY_CARE_PROVIDER_SITE_OTHER): Payer: BC Managed Care – PPO | Admitting: Family Medicine

## 2022-02-24 ENCOUNTER — Encounter: Payer: Self-pay | Admitting: Family Medicine

## 2022-02-24 VITALS — BP 134/89 | HR 80 | Ht 73.0 in | Wt 350.0 lb

## 2022-02-24 DIAGNOSIS — E1140 Type 2 diabetes mellitus with diabetic neuropathy, unspecified: Secondary | ICD-10-CM

## 2022-02-24 DIAGNOSIS — C6991 Malignant neoplasm of unspecified site of right eye: Secondary | ICD-10-CM | POA: Diagnosis not present

## 2022-02-24 LAB — POCT GLYCOSYLATED HEMOGLOBIN (HGB A1C): Hemoglobin A1C: 6.1 % — AB (ref 4.0–5.6)

## 2022-02-24 NOTE — Progress Notes (Signed)
Subjective:    Patient ID: Martin Sanders, male    DOB: 1970/10/31, 51 y.o.   MRN: [id number]  Martin Sanders is a 51 y.o. male presenting on 02/24/2022 for Diabetes and Hypertension   HPI  FOLLOW-UP CHRONIC DM, Type 2 with Mild Peripheral Neuropathy // Morbid Obesity  Controlled Diabetes in past, some fluctuation A1c Prior A1c 5.9 to 7 Recent A1c 6.8 (08/2021) Home CBG >137 avg, based in past quarter following previous difficulty with wife in hospital for gallbladder had limited ability to adhere to regimen.   He did have improvement on GLP1 therapy in past. And had wt loss and sugar control now not tolerating well. Had come off Ozempic.   Continues Trulicity 0.'75mg'$  weekly inj if on full stomach Continues Metformin XR '500mg'$  BID (occasional 3 times a day dosing)  He is active, walking with puppy  Reports good compliance. Tolerating well w/o side-effects Currently on ARB Lifestyle: - Family history of obesity in family, no known diabetes in family, Martin Sanders heritage -Next exam DM Eye Dr [facility] - April 2023  will request, he has done regular check up glasses but not DM eye exam  Ocular melanoma, Right Previously followed for 7 years for monitoring "freckle" in back of eye, he identified at optometry, sent back to [facility]. They put radioactive plaque episcleral on back of R eye on 01/28/22, then it was removed within 1 week on 02/02/22, he has done well overall.  Upcoming test 03/09/22    Health Maintenance: Future Flu Vaccine, Shingles vaccine  Future Colonoscopy.     08/18/2021    8:06 AM 06/19/2021    1:35 PM 02/04/2021    8:47 AM  Depression screen PHQ 2/9  Decreased Interest 0 0 0  Down, Depressed, Hopeless 0 0 0  PHQ - 2 Score 0 0 0  Altered sleeping 0 0 0  Tired, decreased energy 0 0 0  Change in appetite 0 0 0  Feeling bad or failure about yourself  0 0 0  Trouble concentrating 0 0 0  Moving slowly or fidgety/restless 0 0 0  Suicidal  thoughts 0 0 0  PHQ-9 Score 0 0 0  Difficult doing work/chores Not difficult at all Not difficult at all Not difficult at all    Social History   Tobacco Use   Smoking status: Never   Smokeless tobacco: Never  Substance Use Topics   Alcohol use: No    Comment: occasionally 2 glasses wine    Drug use: No    Review of Systems Per HPI unless specifically indicated above     Objective:    BP 134/89   Pulse 80   Ht '6\' 1"'$  (1.854 m)   Wt (!) 350 lb (158.8 kg)   SpO2 98%   BMI 46.18 kg/m   Wt Readings from Last 3 Encounters:  02/24/22 (!) 350 lb (158.8 kg)  08/18/21 (!) 355 lb (161 kg)  06/19/21 (!) 356 lb 12.8 oz (161.8 kg)    Physical Exam Vitals and nursing note reviewed.  Constitutional:      General: He is not in acute distress.    Appearance: Normal appearance. He is well-developed. He is not diaphoretic.     Comments: Well-appearing, comfortable, cooperative  HENT:     Head: Normocephalic and atraumatic.  Eyes:     General:        Right eye: No discharge.        Left eye:  No discharge.     Conjunctiva/sclera: Conjunctivae normal.  Cardiovascular:     Rate and Rhythm: Normal rate.  Pulmonary:     Effort: Pulmonary effort is normal.  Skin:    General: Skin is warm and dry.     Findings: No erythema or rash.  Neurological:     Mental Status: He is alert and oriented to person, place, and time.  Psychiatric:        Mood and Affect: Mood normal.        Behavior: Behavior normal.        Thought Content: Thought content normal.     Comments: Well groomed, good eye contact, normal speech and thoughts       Results for orders placed or performed in visit on 02/24/22  POCT HgB A1C  Result Value Ref Range   Hemoglobin A1C 6.1 (A) 4.0 - 5.6 %      Assessment & Plan:   Problem List Items Addressed This Visit     Controlled type 2 diabetes mellitus with diabetic neuropathy ([place]) - Primary    A1c down to 6.1 On GLP1 Trulicity now tolerating better less  side effect taking w/ meal No hypoglycemia or hyperglycemia Complication with mild peripheral neuropathy localized R foot only in past had similar symptoms The 10-year ASCVD risk score (Arnett DK, et al., 2019) is: 8.6%    Plan:  1. Continue Trulicity 0.'75mg'$  weekly 2. Continue Metformin XR 500 BID 3. Encourage improved lifestyle - low carb, low sugar diet, reduce portion size, continue improving emphasis on regular exercise 4. Continue to check CBG up to 1-2x daily 5. Continue ARB - future consider ASA, Statin      Relevant Orders   POCT HgB A1C (Completed)   Ocular melanoma, right ([place])    Followed by Ophthalmology, s/p procedure, repeat follow up coming up   No orders of the defined types were placed in this encounter.   Next time urine test for kidney health, urine microalbumin  Future vaccines Flu Shot and Shingles shot when ready.  Follow up plan: Return in about 6 months (around 08/25/2022) for 6 month fasting lab only then 1 week later Annual Physical.  Future labs ordered for 08/2022   [name], [facility] 02/24/2022, 3:37 PM

## 2022-02-24 NOTE — Patient Instructions (Addendum)
Thank you for coming to the office today.  Keep up with [facility] as you are scheduled.  Recent Labs    08/11/21 0801 02/24/22 1540  HGBA1C 6.8* 6.1*   Overall excellent work on blood sugar!  Next time urine test for kidney health, urine microalbumin  Future vaccines Flu Shot and Shingles shot when ready.   DUE for FASTING BLOOD WORK (no food or drink after midnight before the lab appointment, only water or coffee without cream/sugar on the morning of)  SCHEDULE "Lab Only" visit in the morning at the clinic for lab draw in 6 MONTHS   - Make sure Lab Only appointment is at about 1 week before your next appointment, so that results will be available  For Lab Results, once available within 2-3 days of blood draw, you can can log in to MyChart online to view your results and a brief explanation. Also, we can discuss results at next follow-up visit.   Please schedule a Follow-up Appointment to: Return in about 6 months (around 08/25/2022) for 6 month fasting lab only then 1 week later Annual Physical.  If you have any other questions or concerns, please feel free to call the office or send a message through [facility]. You may also schedule an earlier appointment if necessary.  Additionally, you may be receiving a survey about your experience at our office within a few days to 1 week by e-mail or mail. We value your feedback.  [name], DO [facility]

## 2022-02-26 ENCOUNTER — Other Ambulatory Visit: Payer: Self-pay | Admitting: Family Medicine

## 2022-02-26 DIAGNOSIS — E1140 Type 2 diabetes mellitus with diabetic neuropathy, unspecified: Secondary | ICD-10-CM

## 2022-02-26 DIAGNOSIS — E559 Vitamin D deficiency, unspecified: Secondary | ICD-10-CM

## 2022-02-26 DIAGNOSIS — E1169 Type 2 diabetes mellitus with other specified complication: Secondary | ICD-10-CM

## 2022-02-26 DIAGNOSIS — Z125 Encounter for screening for malignant neoplasm of prostate: Secondary | ICD-10-CM

## 2022-02-26 DIAGNOSIS — Z0000 Encounter for general adult medical examination without abnormal findings: Secondary | ICD-10-CM

## 2022-02-26 DIAGNOSIS — C6991 Malignant neoplasm of unspecified site of right eye: Secondary | ICD-10-CM | POA: Insufficient documentation

## 2022-02-26 DIAGNOSIS — I10 Essential (primary) hypertension: Secondary | ICD-10-CM

## 2022-02-26 NOTE — Assessment & Plan Note (Signed)
A1c down to 6.1 On GLP1 Trulicity now tolerating better less side effect taking w/ meal No hypoglycemia or hyperglycemia Complication with mild peripheral neuropathy localized R foot only in past had similar symptoms The 10-year ASCVD risk score (Arnett DK, et al., 2019) is: 8.6%    Plan:  1. Continue Trulicity 0.'75mg'$  weekly 2. Continue Metformin XR 500 BID 3. Encourage improved lifestyle - low carb, low sugar diet, reduce portion size, continue improving emphasis on regular exercise 4. Continue to check CBG up to 1-2x daily 5. Continue ARB - future consider ASA, Statin

## 2022-03-26 ENCOUNTER — Other Ambulatory Visit: Payer: Self-pay | Admitting: Family Medicine

## 2022-03-26 DIAGNOSIS — E1140 Type 2 diabetes mellitus with diabetic neuropathy, unspecified: Secondary | ICD-10-CM

## 2022-03-26 DIAGNOSIS — I10 Essential (primary) hypertension: Secondary | ICD-10-CM

## 2022-03-26 NOTE — Telephone Encounter (Signed)
Requested Prescriptions  Pending Prescriptions Disp Refills  . losartan (COZAAR) 25 MG tablet [Pharmacy Med Name: LOSARTAN POTASSIUM 25 MG TAB] 90 tablet 2    Sig: TAKE 1 TABLET BY MOUTH EVERY DAY     Cardiovascular:  Angiotensin Receptor Blockers Failed - 03/26/2022  1:28 AM      Failed - Cr in normal range and within 180 days    Creat  Date Value Ref Range Status  08/11/2021 0.88 0.70 - 1.30 mg/dL Final         Failed - K in normal range and within 180 days    Potassium  Date Value Ref Range Status  08/11/2021 4.8 3.5 - 5.3 mmol/L Final         Passed - Patient is not pregnant      Passed - Last BP in normal range    BP Readings from Last 1 Encounters:  02/24/22 134/89         Passed - Valid encounter within last 6 months    Recent Outpatient Visits          1 month ago Controlled type 2 diabetes mellitus with diabetic neuropathy, without long-term current use of insulin ([place])   [place], DO   7 months ago Annual physical exam   [place], DO   9 months ago Acute right-sided low back pain without sciatica   [place], DO   1 year ago Controlled type 2 diabetes mellitus with diabetic neuropathy, without long-term current use of insulin ([place])   [facility] Olin Hauser, DO   1 year ago Annual physical exam   [place], DO      Future Appointments            In 5 months Parks Ranger, Devonne Doughty, DO [facility], [facility]

## 2022-04-04 ENCOUNTER — Other Ambulatory Visit: Payer: Self-pay | Admitting: Family Medicine

## 2022-04-04 DIAGNOSIS — E1140 Type 2 diabetes mellitus with diabetic neuropathy, unspecified: Secondary | ICD-10-CM

## 2022-04-05 NOTE — Telephone Encounter (Signed)
Requested Prescriptions  Pending Prescriptions Disp Refills  . ACCU-CHEK GUIDE test strip [Pharmacy Med Name: ACCU-CHEK GUIDE TEST STRIP] 200 strip 3    Sig: USE TO CHECK BLOOD SUGAR UP TO 2 X DAILY     Endocrinology: Diabetes - Testing Supplies Passed - 04/04/2022 10:05 PM      Passed - Valid encounter within last 12 months    Recent Outpatient Visits          1 month ago Controlled type 2 diabetes mellitus with diabetic neuropathy, without long-term current use of insulin ([place])   [place], DO   7 months ago Annual physical exam   [place], DO   9 months ago Acute right-sided low back pain without sciatica   [place], DO   1 year ago Controlled type 2 diabetes mellitus with diabetic neuropathy, without long-term current use of insulin [facility])   [facility] Olin Hauser, DO   1 year ago Annual physical exam   [place], DO      Future Appointments            In 4 months Parks Ranger, Devonne Doughty, DO [facility], [facility]

## 2022-06-01 ENCOUNTER — Other Ambulatory Visit: Payer: Self-pay | Admitting: Family Medicine

## 2022-06-01 DIAGNOSIS — E1140 Type 2 diabetes mellitus with diabetic neuropathy, unspecified: Secondary | ICD-10-CM

## 2022-06-02 NOTE — Telephone Encounter (Signed)
Requested Prescriptions  Pending Prescriptions Disp Refills   metFORMIN (GLUCOPHAGE-XR) 500 MG 24 hr tablet [Pharmacy Med Name: METFORMIN HCL ER 500 MG TABLET] 180 tablet 0    Sig: TAKE 1 TABLET BY MOUTH TWICE A DAY     Endocrinology:  Diabetes - Biguanides Failed - 06/01/2022  8:56 AM      Failed - B12 Level in normal range and within 720 days    No results found for: "VITAMINB12"       Passed - Cr in normal range and within 360 days    Creat  Date Value Ref Range Status  08/11/2021 0.88 0.70 - 1.30 mg/dL Final         Passed - HBA1C is between 0 and 7.9 and within 180 days    Hemoglobin A1C  Date Value Ref Range Status  02/24/2022 6.1 (A) 4.0 - 5.6 % Final   Hgb A1c MFr Bld  Date Value Ref Range Status  08/11/2021 6.8 (H) <5.7 % of total Hgb Final    Comment:    For someone without known diabetes, a hemoglobin A1c value of 6.5% or greater indicates that they may have  diabetes and this should be confirmed with a follow-up  test. . For someone with known diabetes, a value <7% indicates  that their diabetes is well controlled and a value  greater than or equal to 7% indicates suboptimal  control. A1c targets should be individualized based on  duration of diabetes, age, comorbid conditions, and  other considerations. . Currently, no consensus exists regarding use of hemoglobin A1c for diagnosis of diabetes for children. .          Passed - eGFR in normal range and within 360 days    GFR, Est African American  Date Value Ref Range Status  07/22/2020 124 > OR = 60 mL/min/1.79m Final   GFR, Est Non African American  Date Value Ref Range Status  07/22/2020 107 > OR = 60 mL/min/1.730mFinal   eGFR  Date Value Ref Range Status  08/11/2021 103 > OR = 60 mL/min/1.7385minal    Comment:    The eGFR is based on the CKD-EPI 2021 equation. To calculate  the new eGFR from a previous Creatinine or Cystatin C result, go to  https://www.kidney.org/professionals/ kdoqi/gfr%5Fcalculator          Passed - Valid encounter within last 6 months    Recent Outpatient Visits           3 months ago Controlled type 2 diabetes mellitus with diabetic neuropathy, without long-term current use of insulin ([facility] [place]   9 months ago Annual physical exam   [place]   11 months ago Acute right-sided low back pain without sciatica   [place]   1 year ago Controlled type 2 diabetes mellitus with diabetic neuropathy, without long-term current use of insulin ([facility] [place]   1 year ago Annual physical exam   [place] [facility]       Future Appointments             In 2 months KarPar[place] RangerleDevonne DoughtyO [facility] normal limits and completed in the last 12 months  WBC  Date Value Ref Range Status  08/11/2021 7.0 3.8 - 10.8 Thousand/uL Final   RBC  Date Value Ref Range Status  08/11/2021 5.93 (H) 4.20 - 5.80 Million/uL Final   Hemoglobin  Date Value Ref Range Status  08/11/2021 16.5 13.2 - 17.1 g/dL Final   HCT  Date Value Ref Range Status  08/11/2021 50.5 (H) 38.5 - 50.0 % Final   MCHC  Date Value Ref Range Status  08/11/2021 32.7 32.0 - 36.0 g/dL Final   [facility]  Date Value Ref Range Status  08/11/2021 27.8 27.0 - 33.0 pg Final   MCV  Date Value Ref Range Status  08/11/2021 85.2 80.0 - 100.0 fL Final   No results found for: "PLTCOUNTKUC", "LABPLAT", "POCPLA" RDW  Date Value Ref Range Status  08/11/2021 14.1 11.0 - 15.0 % Final

## 2022-06-11 DIAGNOSIS — C6931 Malignant neoplasm of right choroid: Secondary | ICD-10-CM | POA: Insufficient documentation

## 2022-06-15 ENCOUNTER — Other Ambulatory Visit: Payer: Self-pay | Admitting: Family Medicine

## 2022-06-15 DIAGNOSIS — E1140 Type 2 diabetes mellitus with diabetic neuropathy, unspecified: Secondary | ICD-10-CM

## 2022-06-15 NOTE — Telephone Encounter (Signed)
Future visit in 2 months .  Requested Prescriptions  Pending Prescriptions Disp Refills   TRULICITY 8.67 YP/9.5KD SOPN [Pharmacy Med Name: TRULICITY 3.26 ZT/2.4 ML PEN] 6 mL 1    Sig: INJECT 0.75 MG SUBCUTANEOUSLY ONE TIME PER WEEK     Endocrinology:  Diabetes - GLP-1 Receptor Agonists Passed - 06/15/2022  3:48 PM      Passed - HBA1C is between 0 and 7.9 and within 180 days    Hemoglobin A1C  Date Value Ref Range Status  02/24/2022 6.1 (A) 4.0 - 5.6 % Final   Hgb A1c MFr Bld  Date Value Ref Range Status  08/11/2021 6.8 (H) <5.7 % of total Hgb Final    Comment:    For someone without known diabetes, a hemoglobin A1c value of 6.5% or greater indicates that they may have  diabetes and this should be confirmed with a follow-up  test. . For someone with known diabetes, a value <7% indicates  that their diabetes is well controlled and a value  greater than or equal to 7% indicates suboptimal  control. A1c targets should be individualized based on  duration of diabetes, age, comorbid conditions, and  other considerations. . Currently, no consensus exists regarding use of hemoglobin A1c for diagnosis of diabetes for children. Renella Cunas - Valid encounter within last 6 months    Recent Outpatient Visits           3 months ago Controlled type 2 diabetes mellitus with diabetic neuropathy, without long-term current use of insulin [facility])   [place], DO   10 months ago Annual physical exam   [place], DO   12 months ago Acute right-sided low back pain without sciatica   [place], DO   1 year ago Controlled type 2 diabetes mellitus with diabetic neuropathy, without long-term current use of insulin [facility])   [facility] Olin Hauser, DO   1 year ago Annual physical exam   [place],  DO       Future Appointments             In 2 months Parks Ranger, Devonne Doughty, [facility], [facility]

## 2022-08-16 ENCOUNTER — Other Ambulatory Visit: Payer: Self-pay | Admitting: Family Medicine

## 2022-08-16 DIAGNOSIS — E1140 Type 2 diabetes mellitus with diabetic neuropathy, unspecified: Secondary | ICD-10-CM

## 2022-08-17 NOTE — Telephone Encounter (Signed)
Requested Prescriptions  Pending Prescriptions Disp Refills   metFORMIN (GLUCOPHAGE-XR) 500 MG 24 hr tablet [Pharmacy Med Name: METFORMIN HCL ER 500 MG TABLET] 180 tablet 0    Sig: TAKE 1 TABLET BY MOUTH TWICE A DAY     Endocrinology:  Diabetes - Biguanides Failed - 08/16/2022  7:07 AM      Failed - Cr in normal range and within 360 days    Creat  Date Value Ref Range Status  08/11/2021 0.88 0.70 - 1.30 mg/dL Final         Failed - eGFR in normal range and within 360 days    GFR, Est African American  Date Value Ref Range Status  07/22/2020 124 > OR = 60 mL/min/1.36m Final   GFR, Est Non African American  Date Value Ref Range Status  07/22/2020 107 > OR = 60 mL/min/1.771mFinal   eGFR  Date Value Ref Range Status  08/11/2021 103 > OR = 60 mL/min/1.7357minal    Comment:    The eGFR is based on the CKD-EPI 2021 equation. To calculate  the new eGFR from a previous Creatinine or Cystatin C result, go to https://www.kidney.org/professionals/ kdoqi/gfr%5Fcalculator          Failed - B12 Level in normal range and within 720 days    No results found for: "VITAMINB12"       Failed - CBC within normal limits and completed in the last 12 months    WBC  Date Value Ref Range Status  08/11/2021 7.0 3.8 - 10.8 Thousand/uL Final   RBC  Date Value Ref Range Status  08/11/2021 5.93 (H) 4.20 - 5.80 Million/uL Final   Hemoglobin  Date Value Ref Range Status  08/11/2021 16.5 13.2 - 17.1 g/dL Final   HCT  Date Value Ref Range Status  08/11/2021 50.5 (H) 38.5 - 50.0 % Final   MCHC  Date Value Ref Range Status  08/11/2021 32.7 32.0 - 36.0 g/dL Final   MCH  Date Value Ref Range Status  08/11/2021 27.8 27.0 - 33.0 pg Final   MCV  Date Value Ref Range Status  08/11/2021 85.2 80.0 - 100.0 fL Final   No results found for: "PLTCOUNTKUC", "LABPLAT", "POCPLA" RDW  Date Value Ref Range Status  08/11/2021 14.1 11.0 - 15.0 % Final         Passed - HBA1C is between 0 and 7.9 and  within 180 days    Hemoglobin A1C  Date Value Ref Range Status  02/24/2022 6.1 (A) 4.0 - 5.6 % Final   Hgb A1c MFr Bld  Date Value Ref Range Status  08/11/2021 6.8 (H) <5.7 % of total Hgb Final    Comment:    For someone without known diabetes, a hemoglobin A1c value of 6.5% or greater indicates that they may have  diabetes and this should be confirmed with a follow-up  test. . For someone with known diabetes, a value <7% indicates  that their diabetes is well controlled and a value  greater than or equal to 7% indicates suboptimal  control. A1c targets should be individualized based on  duration of diabetes, age, comorbid conditions, and  other considerations. . Currently, no consensus exists regarding use of hemoglobin A1c for diagnosis of diabetes for children. .  Renella CunasValid encounter within last 6 months    Recent Outpatient Visits           5 months ago Controlled type 2  diabetes mellitus with diabetic neuropathy, without long-term current use of insulin [facility])   [place], DO   12 months ago Annual physical exam   [place], DO   1 year ago Acute right-sided low back pain without sciatica   [place], DO   1 year ago Controlled type 2 diabetes mellitus with diabetic neuropathy, without long-term current use of insulin [facility])   [place], DO   2 years ago Annual physical exam   [facility] Olin Hauser, DO       Future Appointments             In 1 week Parks Ranger, Devonne Doughty, [facility], [facility]

## 2022-08-20 ENCOUNTER — Other Ambulatory Visit: Payer: Self-pay

## 2022-08-20 DIAGNOSIS — Z125 Encounter for screening for malignant neoplasm of prostate: Secondary | ICD-10-CM

## 2022-08-20 DIAGNOSIS — E1140 Type 2 diabetes mellitus with diabetic neuropathy, unspecified: Secondary | ICD-10-CM

## 2022-08-20 DIAGNOSIS — I10 Essential (primary) hypertension: Secondary | ICD-10-CM

## 2022-08-20 DIAGNOSIS — E1169 Type 2 diabetes mellitus with other specified complication: Secondary | ICD-10-CM

## 2022-08-20 DIAGNOSIS — E559 Vitamin D deficiency, unspecified: Secondary | ICD-10-CM

## 2022-08-20 DIAGNOSIS — Z0000 Encounter for general adult medical examination without abnormal findings: Secondary | ICD-10-CM

## 2022-08-23 ENCOUNTER — Other Ambulatory Visit: Payer: BC Managed Care – PPO

## 2022-08-24 LAB — CBC WITH DIFFERENTIAL/PLATELET
Absolute Monocytes: 533 cells/uL (ref 200–950)
Basophils Absolute: 73 cells/uL (ref 0–200)
Basophils Relative: 1 %
Eosinophils Absolute: 219 cells/uL (ref 15–500)
Eosinophils Relative: 3 %
HCT: 51.7 % — ABNORMAL HIGH (ref 38.5–50.0)
Hemoglobin: 17.8 g/dL — ABNORMAL HIGH (ref 13.2–17.1)
Lymphs Abs: 1453 cells/uL (ref 850–3900)
MCH: 29.1 pg (ref 27.0–33.0)
MCHC: 34.4 g/dL (ref 32.0–36.0)
MCV: 84.6 fL (ref 80.0–100.0)
MPV: 10.1 fL (ref 7.5–12.5)
Monocytes Relative: 7.3 %
Neutro Abs: 5022 cells/uL (ref 1500–7800)
Neutrophils Relative %: 68.8 %
Platelets: 228 10*3/uL (ref 140–400)
RBC: 6.11 10*6/uL — ABNORMAL HIGH (ref 4.20–5.80)
RDW: 14.3 % (ref 11.0–15.0)
Total Lymphocyte: 19.9 %
WBC: 7.3 10*3/uL (ref 3.8–10.8)

## 2022-08-24 LAB — PSA: PSA: 0.49 ng/mL (ref <=4.00)

## 2022-08-24 LAB — COMPLETE METABOLIC PANEL WITH GFR
AG Ratio: 1.5 (calc) (ref 1.0–2.5)
ALT: 18 U/L (ref 9–46)
AST: 12 U/L (ref 10–35)
Albumin: 3.9 g/dL (ref 3.6–5.1)
Alkaline phosphatase (APISO): 66 U/L (ref 35–144)
BUN: 12 mg/dL (ref 7–25)
CO2: 23 mmol/L (ref 20–32)
Calcium: 8.9 mg/dL (ref 8.6–10.3)
Chloride: 108 mmol/L (ref 98–110)
Creat: 0.84 mg/dL (ref 0.70–1.30)
Globulin: 2.6 g/dL (calc) (ref 1.9–3.7)
Glucose, Bld: 124 mg/dL — ABNORMAL HIGH (ref 65–99)
Potassium: 4.8 mmol/L (ref 3.5–5.3)
Sodium: 142 mmol/L (ref 135–146)
Total Bilirubin: 0.6 mg/dL (ref 0.2–1.2)
Total Protein: 6.5 g/dL (ref 6.1–8.1)
eGFR: 104 mL/min/{1.73_m2} (ref >=60)

## 2022-08-24 LAB — TSH: TSH: 3.46 mIU/L (ref 0.40–4.50)

## 2022-08-24 LAB — LIPID PANEL
Cholesterol: 134 mg/dL (ref <200)
HDL: 53 mg/dL (ref >=40)
LDL Cholesterol (Calc): 62 mg/dL (calc)
Non-HDL Cholesterol (Calc): 81 mg/dL (calc) (ref <130)
Total CHOL/HDL Ratio: 2.5 (calc) (ref <5.0)
Triglycerides: 100 mg/dL (ref <150)

## 2022-08-24 LAB — VITAMIN D 25 HYDROXY (VIT D DEFICIENCY, FRACTURES): Vit D, 25-Hydroxy: 25 ng/mL — ABNORMAL LOW (ref 30–100)

## 2022-08-24 LAB — HEMOGLOBIN A1C
Hgb A1c MFr Bld: 6.8 % of total Hgb — ABNORMAL HIGH (ref <5.7)
Mean Plasma Glucose: 148 mg/dL
eAG (mmol/L): 8.2 mmol/L

## 2022-08-30 ENCOUNTER — Encounter: Payer: Self-pay | Admitting: Family Medicine

## 2022-08-30 ENCOUNTER — Telehealth: Payer: Self-pay

## 2022-08-30 ENCOUNTER — Other Ambulatory Visit: Payer: Self-pay

## 2022-08-30 ENCOUNTER — Ambulatory Visit (INDEPENDENT_AMBULATORY_CARE_PROVIDER_SITE_OTHER): Payer: BC Managed Care – PPO | Admitting: Family Medicine

## 2022-08-30 VITALS — BP 136/84 | HR 61 | Ht 73.0 in | Wt 350.0 lb

## 2022-08-30 DIAGNOSIS — K769 Liver disease, unspecified: Secondary | ICD-10-CM | POA: Insufficient documentation

## 2022-08-30 DIAGNOSIS — L739 Follicular disorder, unspecified: Secondary | ICD-10-CM

## 2022-08-30 DIAGNOSIS — Z1211 Encounter for screening for malignant neoplasm of colon: Secondary | ICD-10-CM | POA: Diagnosis not present

## 2022-08-30 DIAGNOSIS — M23329 Other meniscus derangements, posterior horn of medial meniscus, unspecified knee: Secondary | ICD-10-CM | POA: Insufficient documentation

## 2022-08-30 DIAGNOSIS — Z0000 Encounter for general adult medical examination without abnormal findings: Secondary | ICD-10-CM

## 2022-08-30 DIAGNOSIS — M25569 Pain in unspecified knee: Secondary | ICD-10-CM | POA: Insufficient documentation

## 2022-08-30 DIAGNOSIS — Z23 Encounter for immunization: Secondary | ICD-10-CM | POA: Diagnosis not present

## 2022-08-30 DIAGNOSIS — E1140 Type 2 diabetes mellitus with diabetic neuropathy, unspecified: Secondary | ICD-10-CM

## 2022-08-30 MED ORDER — MUPIROCIN 2 % EX OINT: 1.0000 | TOPICAL_OINTMENT | Freq: Two times a day (BID) | CUTANEOUS | 2 refills | Status: DC

## 2022-08-30 NOTE — Assessment & Plan Note (Addendum)
A1c stable at 6.8, controlled No hypoglycemia or hyperglycemia Complication with mild peripheral neuropathy localized R foot only in past had similar symptoms The 10-year ASCVD risk score (Arnett DK, et al., 2019) is: 7.4%    Plan:  1. Continue Trulicity 0.75mg  weekly 2. Continue Metformin XR 500 BID 3. Encourage improved lifestyle - low carb, low sugar diet, reduce portion size, continue improving emphasis on regular exercise 4. Continue to check CBG up to 1-2x daily 5. Continue ARB - future consider ASA, Statin  Reconsider Mounjaro (new upgraded version of GLP injection weekly from [employer]) dosage range starts 2.5 up to 5mg , max is up to 15mg .  My goal would be to order the 5mg  weekly and start there, we can upgrade anytime you are ready. Biggest hurdle is the back order / stock issue.  Please send mychart message / call when you are down to last month and we can submit the order / authorization for [facility] 5mg  weekly

## 2022-08-30 NOTE — Telephone Encounter (Signed)
Gastroenterology Pre-Procedure Review  Request Date: 12/24/22 Requesting Physician: Dr. Vicente Males  PATIENT REVIEW QUESTIONS: The patient responded to the following health history questions as indicated:    1. Are you having any GI issues? no 2. Do you have a personal history of Polyps? no 3. Do you have a family history of Colon Cancer or Polyps? no 4. Diabetes Mellitus? yes (takes Trulicity and Metformin has been advised to stop Trulicity Inj 1 week prior to colonoscopy, stop metformin 2 days prior to colonoscopy.) 5. Joint replacements in the past 12 months?no 6. Major health problems in the past 3 months?no 7. Any artificial heart valves, MVP, or defibrillator?no    MEDICATIONS & ALLERGIES:    Patient reports the following regarding taking any anticoagulation/antiplatelet therapy:   Plavix, Coumadin, Eliquis, Xarelto, Lovenox, Pradaxa, Brilinta, or Effient? no Aspirin? no  Patient confirms/reports the following medications:  Current Outpatient Medications  Medication Sig Dispense Refill   Accu-Chek FastClix Lancets MISC Check sugar up to 2 x daily 204 each 3   ACCU-CHEK GUIDE test strip USE TO CHECK BLOOD SUGAR UP TO 2 X DAILY 200 strip 3   Alpha-Lipoic Acid 100 MG CAPS Take 1 capsule (100 mg total) by mouth daily.  0   baclofen (LIORESAL) 10 MG tablet Take 0.5-1 tablets (5-10 mg total) by mouth 3 (three) times daily as needed for muscle spasms. 30 each 2   Blood Glucose Monitoring Suppl (CONTOUR NEXT EZ MONITOR) w/Device KIT 1 each by Does not apply route 2 (two) times daily. Dx E11.9 1 kit 0   cetirizine-pseudoephedrine (ZYRTEC-D) 5-120 MG tablet Take by mouth.     Cholecalciferol (VITAMIN D3) 125 MCG (5000 UT) CAPS Take 5,000 Units by mouth once a week.     levocetirizine (XYZAL) 5 MG tablet Take by mouth.     lisinopril (ZESTRIL) 5 MG tablet      losartan (COZAAR) 25 MG tablet TAKE 1 TABLET BY MOUTH EVERY DAY 90 tablet 1   metFORMIN (GLUCOPHAGE-XR) 500 MG 24 hr tablet TAKE 1 TABLET  BY MOUTH TWICE A DAY 180 tablet 0   mupirocin ointment (BACTROBAN) 2 % Apply 1 Application topically 2 (two) times daily. For up to 1-2 weeks as needed for skin sores. 22 g 2   SHINGRIX injection Inject 0.5 mL into arm for shingles vaccine, then repeat 2-6 months later for 2nd dose. 0.5 mL 1   sildenafil (REVATIO) 20 MG tablet Take 4 tablets (80 mg total) by mouth as needed (prior to sex). 50 tablet 6   TRULICITY A999333 0000000 SOPN INJECT 0.75 MG SUBCUTANEOUSLY ONE TIME PER WEEK 6 mL 1   No current facility-administered medications for this visit.    Patient confirms/reports the following allergies:  Allergies  Allergen Reactions   Other Other (See Comments)    Pt reports seasonal allergies which causes sneezing, coughing and eye irritation    No orders of the defined types were placed in this encounter.   AUTHORIZATION INFORMATION Primary Insurance: 1D#: Group #:  Secondary Insurance: 1D#: Group #:  SCHEDULE INFORMATION: Date: 12/24/22 Time: Location: [facility]

## 2022-08-30 NOTE — Patient Instructions (Addendum)
Thank you for coming to the office today.  Check w insurance on Shingrix Shingles vaccine 2 doses 2-6 month apart, cost can vary significantly from pharmacy to doctors office.  Tdap vaccine today 10 yr   Referral for Colonoscopy - if you don't hear back, can call them 1-2 weeks, and schedule  [facility]) [place], [place] [address] Phone: [phone]  ----------------  Margret Chance (new upgraded version of GLP injection weekly from [employer]) dosage range starts 2.5 up to 5mg , max is up to 15mg .  My goal would be to order the 5mg  weekly and start there, we can upgrade anytime you are ready. Biggest hurdle is the back order / stock issue.  Please send mychart message / call when you are down to last month and we can submit the order / authorization.  Try mupirocin for topical antibiotic instead of neosporin twice a day for 2 weeks, can use non adherent pad to keep clean Contact if you need an oral antibiotic   Please schedule a Follow-up Appointment to: Return in about 6 months (around 03/02/2023) for 6 month DM A1c.  If you have any other questions or concerns, please feel free to call the office or send a message through [place]. You may also schedule an earlier appointment if necessary.  Additionally, you may be receiving a survey about your experience at our office within a few days to 1 week by e-mail or mail. We value your feedback.  [name], DO [place]

## 2022-08-30 NOTE — Progress Notes (Signed)
Subjective:    Patient ID: Martin Sanders, male    DOB: 15-Jul-1970, 52 y.o.   MRN: [id number]  Martin Sanders is a 52 y.o. male presenting on 08/30/2022 for Annual Exam   HPI  Here for Annual Physical and Lab Review  FOLLOW-UP CHRONIC DM, Type 2 with Mild Peripheral Neuropathy // Morbid Obesity  Controlled Diabetes A1c 6.8 (prior trend 6.1 to 6.8) Home CBG 90 day avg 124.   He did have improvement on GLP1 therapy in past. And had wt loss and sugar control now not tolerating well. Had come off Ozempic.   Continues Trulicity 0.75mg  weekly inj if on full stomach Continues Metformin XR 500mg  BID (occasional 3 times a day dosing)  He is active, walking with puppy  Reports good compliance. Tolerating well w/o side-effects Currently on ARB Results with cholesterol panel showed improved Triglyceride down to 100, and LDL 62 Lifestyle: - Family history of obesity in family, no known diabetes in family, [place] heritage -Next exam DM Eye Dr Phineas Douglas [facility] - April 2023  will request, he has done regular check up glasses but not DM eye exam  Polydipsia Episodic may have flare of increased thirst. Seems to have some higher sugar lately but overall mostly controlled, based on his average   Ocular melanoma, Right Previously followed for 7 years for monitoring "freckle" in back of eye, he identified at optometry, sent back to [facility]. They put radioactive plaque episcleral on back of R eye on 01/28/22, then it was removed within 1 week on 02/02/22, he has done well overall. Updates Jan 2024, Hepatic lesion, future check MRI for surveillance, couldn't get MRI done due to claustrophobia   Vitamin D Deficiency Last result 25, previously [place] exposure coming up with the summer   Health Maintenance:  PSA - 0.49 (08/2022) negative, stable from prior check 0.4 to 0.5  Tdap vaccine today  Future Shingrix  Ordered Colonoscopy referral, initial screening  today      08/30/2022   11:06 PM 08/18/2021    8:06 AM 06/19/2021    1:35 PM  Depression screen PHQ 2/9  Decreased Interest 0 0 0  Down, Depressed, Hopeless 0 0 0  PHQ - 2 Score 0 0 0  Altered sleeping  0 0  Tired, decreased energy  0 0  Change in appetite  0 0  Feeling bad or failure about yourself   0 0  Trouble concentrating  0 0  Moving slowly or fidgety/restless  0 0  Suicidal thoughts  0 0  PHQ-9 Score  0 0  Difficult doing work/chores  Not difficult at all Not difficult at all    Past Medical History:  Diagnosis Date   Allergy    Past Surgical History:  Procedure Laterality Date   KNEE ARTHROSCOPY AND ARTHROTOMY     Social History   Socioeconomic History   Marital status: Married    Spouse name: Not on file   Number of children: Not on file   Years of education: Not on file   Highest education level: Not on file  Occupational History   Not on file  Tobacco Use   Smoking status: Never   Smokeless tobacco: Never  Substance and Sexual Activity   Alcohol use: No    Comment: occasionally 2 glasses wine    Drug use: No   Sexual activity: Not on file  Other Topics Concern   Not on file  Social History  Narrative   Not on file   Social Determinants of Health   Financial Resource Strain: Not on file  Food Insecurity: Not on file  Transportation Needs: Not on file  Physical Activity: Not on file  Stress: Not on file  Social Connections: Not on file  Intimate Partner Violence: Not on file   History reviewed. No pertinent family history. Current Outpatient Medications on File Prior to Visit  Medication Sig   Accu-Chek FastClix Lancets MISC Check sugar up to 2 x daily   ACCU-CHEK GUIDE test strip USE TO CHECK BLOOD SUGAR UP TO 2 X DAILY   Alpha-Lipoic Acid 100 MG CAPS Take 1 capsule (100 mg total) by mouth daily.   baclofen (LIORESAL) 10 MG tablet Take 0.5-1 tablets (5-10 mg total) by mouth 3 (three) times daily as needed for muscle spasms.   Blood Glucose  Monitoring Suppl (CONTOUR NEXT EZ MONITOR) w/Device KIT 1 each by Does not apply route 2 (two) times daily. Dx E11.9   cetirizine-pseudoephedrine (ZYRTEC-D) 5-120 MG tablet Take by mouth.   Cholecalciferol (VITAMIN D3) 125 MCG (5000 UT) CAPS Take 5,000 Units by mouth once a week.   losartan (COZAAR) 25 MG tablet TAKE 1 TABLET BY MOUTH EVERY DAY   metFORMIN (GLUCOPHAGE-XR) 500 MG 24 hr tablet TAKE 1 TABLET BY MOUTH TWICE A DAY   SHINGRIX injection Inject 0.5 mL into arm for shingles vaccine, then repeat 2-6 months later for 2nd dose.   sildenafil (REVATIO) 20 MG tablet Take 4 tablets (80 mg total) by mouth as needed (prior to sex).   TRULICITY A999333 0000000 SOPN INJECT 0.75 MG SUBCUTANEOUSLY ONE TIME PER WEEK   No current facility-administered medications on file prior to visit.    Review of Systems  Constitutional:  Negative for activity change, appetite change, chills, diaphoresis, fatigue and fever.  HENT:  Negative for congestion and hearing loss.   Eyes:  Negative for visual disturbance.  Respiratory:  Negative for cough, chest tightness, shortness of breath and wheezing.   Cardiovascular:  Negative for chest pain, palpitations and leg swelling.  Gastrointestinal:  Negative for abdominal pain, constipation, diarrhea, nausea and vomiting.  Genitourinary:  Negative for dysuria, frequency and hematuria.  Musculoskeletal:  Negative for arthralgias and neck pain.  Skin:  Negative for rash.  Neurological:  Negative for dizziness, weakness, light-headedness, numbness and headaches.  Hematological:  Negative for adenopathy.  Psychiatric/Behavioral:  Negative for behavioral problems, dysphoric mood and sleep disturbance.    Per HPI unless specifically indicated above      Objective:    BP 136/84   Pulse 61   Ht 6\' 1"  (1.854 m)   Wt (!) 350 lb (158.8 kg)   SpO2 96%   BMI 46.18 kg/m   Wt Readings from Last 3 Encounters:  08/30/22 (!) 350 lb (158.8 kg)  02/24/22 (!) 350 lb (158.8 kg)   08/18/21 (!) 355 lb (161 kg)    Physical Exam Vitals and nursing note reviewed.  Constitutional:      General: He is not in acute distress.    Appearance: He is well-developed. He is obese. He is not diaphoretic.     Comments: Well-appearing, comfortable, cooperative  HENT:     Head: Normocephalic and atraumatic.  Eyes:     General:        Right eye: No discharge.        Left eye: No discharge.     Conjunctiva/sclera: Conjunctivae normal.     Pupils: Pupils are equal, round,  and reactive to light.  Neck:     Thyroid: No thyromegaly.     Vascular: No carotid bruit.  Cardiovascular:     Rate and Rhythm: Normal rate and regular rhythm.     Pulses: Normal pulses.     Heart sounds: Normal heart sounds. No murmur heard. Pulmonary:     Effort: Pulmonary effort is normal. No respiratory distress.     Breath sounds: Normal breath sounds. No wheezing or rales.  Abdominal:     General: Bowel sounds are normal. There is no distension.     Palpations: Abdomen is soft. There is no mass.     Tenderness: There is no abdominal tenderness.  Musculoskeletal:        General: No tenderness. Normal range of motion.     Cervical back: Normal range of motion and neck supple.     Right lower leg: No edema.     Left lower leg: No edema.     Comments: Upper / Lower Extremities: - Normal muscle tone, strength bilateral upper extremities 5/5, lower extremities 5/5  Lymphadenopathy:     Cervical: No cervical adenopathy.  Skin:    General: Skin is warm and dry.     Findings: No erythema or rash.  Neurological:     Mental Status: He is alert and oriented to person, place, and time.     Comments: Distal sensation intact to light touch all extremities  Psychiatric:        Mood and Affect: Mood normal.        Behavior: Behavior normal.        Thought Content: Thought content normal.     Comments: Well groomed, good eye contact, normal speech and thoughts      Results for orders placed or  performed in visit on 08/20/22  VITAMIN D 25 Hydroxy (Vit-D Deficiency, Fractures)  Result Value Ref Range   Vit D, 25-Hydroxy 25 (L) 30 - 100 ng/mL  TSH  Result Value Ref Range   TSH 3.46 0.40 - 4.50 mIU/L  PSA  Result Value Ref Range   PSA 0.49 < OR = 4.00 ng/mL  Hemoglobin A1c  Result Value Ref Range   Hgb A1c MFr Bld 6.8 (H) <5.7 % of total Hgb   Mean Plasma Glucose 148 mg/dL   eAG (mmol/L) 8.2 mmol/L  Lipid panel  Result Value Ref Range   Cholesterol 134 <200 mg/dL   HDL 53 > OR = 40 mg/dL   Triglycerides 100 <150 mg/dL   LDL Cholesterol (Calc) 62 mg/dL (calc)   Total CHOL/HDL Ratio 2.5 <5.0 (calc)   Non-HDL Cholesterol (Calc) 81 <130 mg/dL (calc)  CBC with Differential/Platelet  Result Value Ref Range   WBC 7.3 3.8 - 10.8 Thousand/uL   RBC 6.11 (H) 4.20 - 5.80 Million/uL   Hemoglobin 17.8 (H) 13.2 - 17.1 g/dL   HCT 51.7 (H) 38.5 - 50.0 %   MCV 84.6 80.0 - 100.0 fL   MCH 29.1 27.0 - 33.0 pg   MCHC 34.4 32.0 - 36.0 g/dL   RDW 14.3 11.0 - 15.0 %   Platelets 228 140 - 400 Thousand/uL   MPV 10.1 7.5 - 12.5 fL   Neutro Abs 5,022 1,500 - 7,800 cells/uL   Lymphs Abs 1,453 850 - 3,900 cells/uL   Absolute Monocytes 533 200 - 950 cells/uL   Eosinophils Absolute 219 15 - 500 cells/uL   Basophils Absolute 73 0 - 200 cells/uL   Neutrophils Relative % 68.8 %  Total Lymphocyte 19.9 %   Monocytes Relative 7.3 %   Eosinophils Relative 3.0 %   Basophils Relative 1.0 %  COMPLETE METABOLIC PANEL WITH GFR  Result Value Ref Range   Glucose, Bld 124 (H) 65 - 99 mg/dL   BUN 12 7 - 25 mg/dL   Creat 0.84 0.70 - 1.30 mg/dL   eGFR 104 > OR = 60 mL/min/1.81m2   BUN/Creatinine Ratio SEE NOTE: 6 - 22 (calc)   Sodium 142 135 - 146 mmol/L   Potassium 4.8 3.5 - 5.3 mmol/L   Chloride 108 98 - 110 mmol/L   CO2 23 20 - 32 mmol/L   Calcium 8.9 8.6 - 10.3 mg/dL   Total Protein 6.5 6.1 - 8.1 g/dL   Albumin 3.9 3.6 - 5.1 g/dL   Globulin 2.6 1.9 - 3.7 g/dL (calc)   AG Ratio 1.5 1.0 - 2.5  (calc)   Total Bilirubin 0.6 0.2 - 1.2 mg/dL   Alkaline phosphatase (APISO) 66 35 - 144 U/L   AST 12 10 - 35 U/L   ALT 18 9 - 46 U/L      Assessment & Plan:   Problem List Items Addressed This Visit     Controlled type 2 diabetes mellitus with diabetic neuropathy (HCC)    A1c stable at 6.8, controlled No hypoglycemia or hyperglycemia Complication with mild peripheral neuropathy localized R foot only in past had similar symptoms The 10-year ASCVD risk score (Arnett DK, et al., 2019) is: 7.4%    Plan:  1. Continue Trulicity 0.75mg  weekly 2. Continue Metformin XR 500 BID 3. Encourage improved lifestyle - low carb, low sugar diet, reduce portion size, continue improving emphasis on regular exercise 4. Continue to check CBG up to 1-2x daily 5. Continue ARB - future consider ASA, Statin  Reconsider Mounjaro (new upgraded version of GLP injection weekly from [employer]) dosage range starts 2.5 up to 5mg , max is up to 15mg .  My goal would be to order the 5mg  weekly and start there, we can upgrade anytime you are ready. Biggest hurdle is the back order / stock issue.  Please send mychart message / call when you are down to last month and we can submit the order / authorization for Mounjaro 5mg  weekly      Folliculitis   Relevant Medications   mupirocin ointment (BACTROBAN) 2 %   Other Visit Diagnoses     Annual physical exam    -  Primary   Screening for colon cancer       Relevant Orders   Ambulatory referral to Gastroenterology   Need for Tdap vaccination       Relevant Orders   Tdap vaccine greater than or equal to 7yo IM (Completed)       Updated Health Maintenance information Reviewed recent lab results with patient Encouraged improvement to lifestyle with diet and exercise Goal of weight loss  Folliculitis forearm Recurrent issue from previous Rx topical Mupirocin TWICE A DAY 1-2 weeks May use hibiclens wash still Future reconsider oral antibiotic vs Derm  Tdap  vaccine today  Check insurance for shingrix coverage, can do here or at pharmacy  Referral for Colonoscopy to [facility], no prior colon cancer screening done.  Ocular melanoma Followed by [facility] / [facility]hthalmology Note on imaging identified Hepatic lesion but this was not pursued due to patient unable to complete the abdominal MRI due to claustrophobia w/ MRI machine  Orders Placed This Encounter  Procedures   Tdap vaccine greater than  or equal to 7yo IM   Ambulatory referral to Gastroenterology    Referral Priority:   Routine    Referral Type:   Consultation    Referral Reason:   Specialty Services Required    Number of Visits Requested:   1     Meds ordered this encounter  Medications   mupirocin ointment (BACTROBAN) 2 %    Sig: Apply 1 Application topically 2 (two) times daily. For up to 1-2 weeks as needed for skin sores.    Dispense:  22 g    Refill:  2      Follow up plan: Return in about 6 months (around 03/02/2023) for 6 month DM A1c.  [name], [facility] 08/30/2022, 8:19 AM

## 2022-09-14 ENCOUNTER — Other Ambulatory Visit: Payer: Self-pay | Admitting: Family Medicine

## 2022-09-14 DIAGNOSIS — I10 Essential (primary) hypertension: Secondary | ICD-10-CM

## 2022-09-14 DIAGNOSIS — E1140 Type 2 diabetes mellitus with diabetic neuropathy, unspecified: Secondary | ICD-10-CM

## 2022-09-14 NOTE — Telephone Encounter (Signed)
Requested Prescriptions  Pending Prescriptions Disp Refills   losartan (COZAAR) 25 MG tablet [Pharmacy Med Name: LOSARTAN POTASSIUM 25 MG TAB] 90 tablet 1    Sig: TAKE 1 TABLET BY MOUTH EVERY DAY     Cardiovascular:  Angiotensin Receptor Blockers Passed - 09/14/2022  2:29 AM      Passed - Cr in normal range and within 180 days    Creat  Date Value Ref Range Status  08/23/2022 0.84 0.70 - 1.30 mg/dL Final         Passed - K in normal range and within 180 days    Potassium  Date Value Ref Range Status  08/23/2022 4.8 3.5 - 5.3 mmol/L Final         Passed - Patient is not pregnant      Passed - Last BP in normal range    BP Readings from Last 1 Encounters:  08/30/22 136/84         Passed - Valid encounter within last 6 months    Recent Outpatient Visits           2 weeks ago Annual physical exam   [place] [facility] [place], Netta Neat, DO   6 months ago Controlled type 2 diabetes mellitus with diabetic neuropathy, without long-term current use of insulin [facility])   [place] [facility] [place], Netta Neat, DO   1 year ago Annual physical exam   [place] [facility] Smitty Cords, DO   1 year ago Acute right-sided low back pain without sciatica   [place] [facility] Smitty Cords, DO   1 year ago Controlled type 2 diabetes mellitus with diabetic neuropathy, without long-term current use of insulin [facility])   [place] [facility] Althea Charon, Netta Neat, DO       Future Appointments             In 5 months Althea Charon, Netta Neat, DO [place] [facility]

## 2022-11-01 ENCOUNTER — Other Ambulatory Visit: Payer: Self-pay | Admitting: Family Medicine

## 2022-11-01 DIAGNOSIS — E1140 Type 2 diabetes mellitus with diabetic neuropathy, unspecified: Secondary | ICD-10-CM

## 2022-11-02 NOTE — Telephone Encounter (Signed)
Requested by interface surescripts. Future visit in 4 months.  Requested Prescriptions  Pending Prescriptions Disp Refills   metFORMIN (GLUCOPHAGE-XR) 500 MG 24 hr tablet [Pharmacy Med Name: METFORMIN HCL ER 500 MG TABLET] 180 tablet 0    Sig: TAKE 1 TABLET BY MOUTH TWICE A DAY     Endocrinology:  Diabetes - Biguanides Failed - 11/01/2022 12:24 AM      Failed - B12 Level in normal range and within 720 days    No results found for: "VITAMINB12"       Passed - Cr in normal range and within 360 days    Creat  Date Value Ref Range Status  08/23/2022 0.84 0.70 - 1.30 mg/dL Final         Passed - HBA1C is between 0 and 7.9 and within 180 days    Hgb A1c MFr Bld  Date Value Ref Range Status  08/23/2022 6.8 (H) <5.7 % of total Hgb Final    Comment:    For someone without known diabetes, a hemoglobin A1c value of 6.5% or greater indicates that they may have  diabetes and this should be confirmed with a follow-up  test. . For someone with known diabetes, a value <7% indicates  that their diabetes is well controlled and a value  greater than or equal to 7% indicates suboptimal  control. A1c targets should be individualized based on  duration of diabetes, age, comorbid conditions, and  other considerations. . Currently, no consensus exists regarding use of hemoglobin A1c for diagnosis of diabetes for children. .          Passed - eGFR in normal range and within 360 days    GFR, Est African American  Date Value Ref Range Status  07/22/2020 124 > OR = 60 mL/min/1.69m2 Final   GFR, Est Non African American  Date Value Ref Range Status  07/22/2020 107 > OR = 60 mL/min/1.74m2 Final   eGFR  Date Value Ref Range Status  08/23/2022 104 > OR = 60 mL/min/1.58m2 Final         Passed - Valid encounter within last 6 months    Recent Outpatient Visits           2 months ago Annual physical exam   [place] [facility] [place], Netta Neat, DO   8 months ago  Controlled type 2 diabetes mellitus with diabetic neuropathy, without long-term current use of insulin [facility])   [place] [facility] Althea Charon, Netta Neat, DO   1 year ago Annual physical exam   [place] [facility] Smitty Cords, DO   1 year ago Acute right-sided low back pain without sciatica   [place] [facility] Smitty Cords, DO   1 year ago Controlled type 2 diabetes mellitus with diabetic neuropathy, without long-term current use of insulin [facility])   [place] [facility] Althea Charon, Netta Neat, DO       Future Appointments             In 4 months Althea Charon, Netta Neat, DO [place] [facility]            Passed - CBC within normal limits and completed in the last 12 months    WBC  Date Value Ref Range Status  08/23/2022 7.3 3.8 - 10.8 Thousand/uL Final   RBC  Date Value Ref Range Status  08/23/2022 6.11 (H) 4.20 - 5.80  Million/uL Final   Hemoglobin  Date Value Ref Range Status  08/23/2022 17.8 (H) 13.2 - 17.1 g/dL Final   HCT  Date Value Ref Range Status  08/23/2022 51.7 (H) 38.5 - 50.0 % Final   MCHC  Date Value Ref Range Status  08/23/2022 34.4 32.0 - 36.0 g/dL Final   [facility]  Date Value Ref Range Status  08/23/2022 29.1 27.0 - 33.0 pg Final   MCV  Date Value Ref Range Status  08/23/2022 84.6 80.0 - 100.0 fL Final   No results found for: "PLTCOUNTKUC", "LABPLAT", "POCPLA" RDW  Date Value Ref Range Status  08/23/2022 14.3 11.0 - 15.0 % Final

## 2022-12-06 ENCOUNTER — Other Ambulatory Visit: Payer: Self-pay | Admitting: Family Medicine

## 2022-12-06 ENCOUNTER — Encounter: Payer: Self-pay | Admitting: Family Medicine

## 2022-12-06 DIAGNOSIS — E1140 Type 2 diabetes mellitus with diabetic neuropathy, unspecified: Secondary | ICD-10-CM

## 2022-12-06 MED ORDER — MOUNJARO 5 MG/0.5ML ~~LOC~~ SOAJ: 5.0000 mg | SUBCUTANEOUS | 0 refills | Status: DC

## 2022-12-07 NOTE — Telephone Encounter (Signed)
Requested medications are due for refill today.  No see note  Requested medications are on the active medications list.  yes  Last refill. 12/06/2022 6ml 0rf - not filled  Future visit scheduled.   yes  Notes to clinic.  Pharmacy comment: Script Clarification:PLEASE CLARIFY THE DOS EOF MONJUARO AS PT HAS TRULICITY 0.75MG  IN JANUARY, DO NOT HAVE ANY RECORD OF HIGHER DOSE OF TRULICITY, SHOUL HE START FROM 2.5MG ?    Requested Prescriptions  Pending Prescriptions Disp Refills   MOUNJARO 5 MG/0.5ML Pen [Pharmacy Med Name: MOUNJARO 5 MG/0.5 ML PEN]  0    Sig: INJECT 5 MG SUBCUTANEOUSLY WEEKLY     Off-Protocol Failed - 12/06/2022  6:06 PM      Failed - Medication not assigned to a protocol, review manually.      Passed - Valid encounter within last 12 months    Recent Outpatient Visits           3 months ago Annual physical exam   [place] [facility], Netta Neat, DO   9 months ago Controlled type 2 diabetes mellitus with diabetic neuropathy, without long-term current use of insulin [facility])   [place] [facility] Althea Charon, Netta Neat, DO   1 year ago Annual physical exam   [place] [facility] Smitty Cords, DO   1 year ago Acute right-sided low back pain without sciatica   [place] [facility] Smitty Cords, DO   1 year ago Controlled type 2 diabetes mellitus with diabetic neuropathy, without long-term current use of insulin [facility])   [place] [facility] Althea Charon, Netta Neat, DO       Future Appointments             In 3 months Althe[place] Charon, Netta Neat, DO [place] [facility], [facility]

## 2022-12-24 ENCOUNTER — Ambulatory Visit: Admit: 2022-12-24 | Payer: BC Managed Care – PPO | Admitting: Gastroenterology

## 2022-12-24 SURGERY — COLONOSCOPY WITH PROPOFOL
Anesthesia: General

## 2022-12-30 ENCOUNTER — Other Ambulatory Visit: Payer: Self-pay

## 2022-12-30 DIAGNOSIS — I10 Essential (primary) hypertension: Secondary | ICD-10-CM

## 2022-12-30 DIAGNOSIS — E1140 Type 2 diabetes mellitus with diabetic neuropathy, unspecified: Secondary | ICD-10-CM

## 2022-12-30 MED ORDER — METFORMIN HCL ER 500 MG PO TB24: 500.0000 mg | ORAL_TABLET | Freq: Two times a day (BID) | ORAL | 0 refills | Status: DC

## 2022-12-30 MED ORDER — LOSARTAN POTASSIUM 25 MG PO TABS: 25.0000 mg | ORAL_TABLET | Freq: Every day | ORAL | 1 refills | Status: DC

## 2022-12-30 MED ORDER — MOUNJARO 5 MG/0.5ML ~~LOC~~ SOAJ: 5.0000 mg | SUBCUTANEOUS | 0 refills | Status: DC

## 2023-03-07 ENCOUNTER — Other Ambulatory Visit: Payer: Self-pay | Admitting: Family Medicine

## 2023-03-07 ENCOUNTER — Ambulatory Visit: Payer: BC Managed Care – PPO | Admitting: Family Medicine

## 2023-03-07 ENCOUNTER — Encounter: Payer: Self-pay | Admitting: Family Medicine

## 2023-03-07 VITALS — BP 110/72 | HR 49 | Resp 16 | Ht 73.0 in | Wt 353.0 lb

## 2023-03-07 DIAGNOSIS — Z23 Encounter for immunization: Secondary | ICD-10-CM

## 2023-03-07 DIAGNOSIS — Z125 Encounter for screening for malignant neoplasm of prostate: Secondary | ICD-10-CM

## 2023-03-07 DIAGNOSIS — N529 Male erectile dysfunction, unspecified: Secondary | ICD-10-CM | POA: Diagnosis not present

## 2023-03-07 DIAGNOSIS — E559 Vitamin D deficiency, unspecified: Secondary | ICD-10-CM

## 2023-03-07 DIAGNOSIS — E1140 Type 2 diabetes mellitus with diabetic neuropathy, unspecified: Secondary | ICD-10-CM | POA: Diagnosis not present

## 2023-03-07 DIAGNOSIS — I10 Essential (primary) hypertension: Secondary | ICD-10-CM

## 2023-03-07 DIAGNOSIS — Z0000 Encounter for general adult medical examination without abnormal findings: Secondary | ICD-10-CM

## 2023-03-07 DIAGNOSIS — E1169 Type 2 diabetes mellitus with other specified complication: Secondary | ICD-10-CM

## 2023-03-07 LAB — POCT GLYCOSYLATED HEMOGLOBIN (HGB A1C): Hemoglobin A1C: 6.1 % — AB (ref 4.0–5.6)

## 2023-03-07 MED ORDER — MOUNJARO 7.5 MG/0.5ML ~~LOC~~ SOAJ
7.5000 mg | SUBCUTANEOUS | 1 refills | Status: DC
Start: 2023-03-07 — End: 2023-09-12

## 2023-03-07 MED ORDER — METFORMIN HCL ER 500 MG PO TB24
500.0000 mg | ORAL_TABLET | Freq: Two times a day (BID) | ORAL | 3 refills | Status: DC
Start: 2023-03-07 — End: 2024-03-13

## 2023-03-07 MED ORDER — SILDENAFIL CITRATE 20 MG PO TABS
80.0000 mg | ORAL_TABLET | ORAL | 6 refills | Status: DC | PRN
Start: 2023-03-07 — End: 2023-09-12

## 2023-03-07 MED ORDER — ACCU-CHEK GUIDE VI STRP
ORAL_STRIP | 3 refills | Status: DC
Start: 2023-03-07 — End: 2024-03-13

## 2023-03-07 NOTE — Patient Instructions (Addendum)
Thank you for coming to the office today.  Recent Labs    08/23/22 0803 03/07/23 0819  HGBA1C 6.8* 6.1*   Dose increase Mounjaro from 5 to 7.5mg  weekly for better weight loss  We can keep adjusting up towards 10 - 12 - 15  Refilled Metformin. Keep same dose.  Printed Sildenafil  Flu Shot today  DUE for FASTING BLOOD WORK (no food or drink after midnight before the lab appointment, only water or coffee without cream/sugar on the morning of)  SCHEDULE "Lab Only" visit in the morning at the clinic for lab draw in 6  MONTHS   - Make sure Lab Only appointment is at about 1 week before your next appointment, so that results will be available  For Lab Results, once available within 2-3 days of blood draw, you can can log in to MyChart online to view your results and a brief explanation. Also, we can discuss results at next follow-up visit.   Please schedule a Follow-up Appointment to: Return in about 6 months (around 09/04/2023) for 6 month fasting lab only then 1 week later Annual Physical (mon  AM 1st apt).  If you have any other questions or concerns, please feel free to call the office or send a message through MyChart. You may also schedule an earlier appointment if necessary.  Additionally, you may be receiving a survey about your experience at our office within a few days to 1 week by e-mail or mail. We value your feedback.  Saralyn Pilar, DO [facility], [place]

## 2023-03-07 NOTE — Progress Notes (Signed)
Subjective:    Patient ID: Martin Sanders, male    DOB: Jan 04, 1971, 52 y.o.   MRN: [id number]  Martin Sanders is a 52 y.o. male presenting on 03/07/2023 for Diabetes and Hypertension   HPI  Discussed the use of AI scribe software for clinical note transcription with the patient, who gave verbal consent to proceed.  History of Present Illness    The patient, with a history of diabetes, hypertension, and ocular melanoma, presents for a routine follow-up.     FOLLOW-UP CHRONIC DM, Type 2 with Mild Peripheral Neuropathy // Morbid Obesity  Controlled Diabetes A1c 6.1, improved lower range from 6.8 Home CBG 90 day avg 117   Continues Mounjaro 5mg  weekly inj - interested in dose increase today Continues Metformin XR 500mg  x2 = 1000mg , once daily, need refills  He is active, walking with puppy  Reports good compliance. 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 However, they note that it is starting to affect their vision, as expected.   Left shoulder tendonitis The patient also reports a history of left shoulder tendonitis, which has improved with the use of Celebrex. They are able to move their arm without pain.      Health Maintenance:     Future Shingrix   Ordered Colonoscopy referral previously, Dr Tobi Bastos GI - prior scheduled 12/2022 colonoscopy, cancelled due to life events and needs to be rescheduled.       03/07/2023    8:11 AM 08/30/2022   11:06 PM 08/18/2021     8:06 AM  Depression screen PHQ 2/9  Decreased Interest 0 0 0  Down, Depressed, Hopeless 0 0 0  PHQ - 2 Score 0 0 0  Altered sleeping   0  Tired, decreased energy   0  Change in appetite   0  Feeling bad or failure about yourself    0  Trouble concentrating   0  Moving slowly or fidgety/restless   0  Suicidal thoughts   0  PHQ-9 Score   0  Difficult doing work/chores   Not difficult at all    Social History   Tobacco Use   Smoking status: Never   Smokeless tobacco: Never  Substance Use Topics   Alcohol use: No    Comment: occasionally 2 glasses wine    Drug use: No    Review of Systems Per HPI unless specifically indicated above     Objective:    BP 110/72   Pulse (!) 49   Resp 16   Ht 6\' 1"  (1.854 m)   Wt (!) 353 lb (160.1 kg)   SpO2 96%   BMI 46.57 kg/m   Wt Readings from Last 3 Encounters:  03/07/23 (!) 353 lb (160.1 kg)  08/30/22 (!) 350 lb (158.8 kg)  02/24/22 (!) 350 lb (158.8 kg)    Physical Exam Vitals and nursing note reviewed.  Constitutional:      General: He is not in acute distress.  Appearance: Normal appearance. He is well-developed. He is obese. He is not diaphoretic.     Comments: Well-appearing, comfortable, cooperative  HENT:     Head: Normocephalic and atraumatic.  Eyes:     General:        Right eye: No discharge.        Left eye: No discharge.     Conjunctiva/sclera: Conjunctivae normal.  Cardiovascular:     Rate and Rhythm: Normal rate.  Pulmonary:     Effort: Pulmonary effort is normal.  Skin:    General: Skin is warm and dry.     Findings: No erythema or rash.  Neurological:     Mental Status: He is alert and oriented to person, place, and time.  Psychiatric:        Mood and Affect: Mood normal.        Behavior: Behavior normal.        Thought Content: Thought content normal.     Comments: Well groomed, good eye contact, normal speech and thoughts       Results for orders placed or performed in visit on 03/07/23   POCT glycosylated hemoglobin (Hb A1C)  Result Value Ref Range   Hemoglobin A1C 6.1 (A) 4.0 - 5.6 %      Assessment & Plan:   Problem List Items Addressed This Visit     Controlled type 2 diabetes mellitus with diabetic neuropathy (HCC) - Primary   Relevant Medications   MOUNJARO 7.5 MG/0.5ML Pen   metFORMIN (GLUCOPHAGE-XR) 500 MG 24 hr tablet   glucose blood (ACCU-CHEK GUIDE) test strip   Other Relevant Orders   POCT glycosylated hemoglobin (Hb A1C) (Completed)   ED (erectile dysfunction)   Relevant Medications   sildenafil (REVATIO) 20 MG tablet   Other Visit Diagnoses     Need for influenza vaccination       Relevant Orders   Flu vaccine trivalent PF, 6mos and older(Flulaval,Afluria,Fluarix,Fluzone) (Completed)       Assessment and Plan    Type 2 Diabetes Mellitus Improved glycemic control with recent HbA1c of 6.1, down from 6.8. Patient is adherent to Metformin and Mounjaro, with a plan to increase Mounjaro for potential additional weight loss. -Increase Mounjaro from 5mg  to 7.5mg  weekly. -Refill Metformin 500mg  ER BID. -Check HbA1c in March.  Colon Cancer Screening Delayed due to personal circumstances. Last colonoscopy was in July 2024. -Reschedule colonoscopy with Dr. Tobi Bastos at [facility] at patient's convenience.  Ocular Melanoma Ongoing management with [facility]. Recent report indicates shrinkage of the melanoma. -Continue follow-up with [facility] as directed.  Left Shoulder Tendonitis Improved with Celebrex. -Continue Celebrex as needed for pain control.  General Health Maintenance -Administer influenza vaccine today. Future shingrix -Refill Sildenafil -Refill Accuchek test strips.          Orders Placed This Encounter  Procedures   Flu vaccine trivalent PF, 6mos and older(Flulaval,Afluria,Fluarix,Fluzone)   POCT glycosylated hemoglobin (Hb A1C)     Meds ordered this encounter  Medications   MOUNJARO 7.5 MG/0.5ML Pen    Sig:  Inject 7.5 mg into the skin once a week.    Dispense:  6 mL    Refill:  1    Dose increase from Mounjaro 5mg  weekly up to 7.5mg  weekly.   metFORMIN (GLUCOPHAGE-XR) 500 MG 24 hr tablet    Sig: Take 1 tablet (500 mg total) by mouth 2 (two) times daily with a meal.    Dispense:  180 tablet    Refill:  3    Add refills   glucose blood (ACCU-CHEK GUIDE) test strip    Sig: USE TO CHECK BLOOD SUGAR UP TO 2 X DAILY    Dispense:  200 strip    Refill:  3   sildenafil (REVATIO) 20 MG tablet    Sig: Take 4 tablets (80 mg total) by mouth as needed (prior to sex).    Dispense:  50 tablet    Refill:  6     Follow up plan: Return in about 6 months (around 09/04/2023) for 6 month fasting lab only then 1 week later Annual Physical (mon  AM 1st apt).  Future labs ordered for 09/05/23  Saralyn Pilar, DO [facility] 03/07/2023, 8:11 AM

## 2023-06-13 NOTE — Progress Notes (Signed)
 PA started   Meribeth Aly (Key: [id number]) Rx #: [id number] Need Help? Call us  at [phone] Status New (Not sent to plan) Drug Mounjaro  7.5MG /0.5ML auto-injectors ePA cloud logo Form [employer] Electronic PA Form [phone] NCPDP) Original Claim Info 6

## 2023-08-23 ENCOUNTER — Ambulatory Visit: Payer: Self-pay | Admitting: Family Medicine

## 2023-08-23 NOTE — Telephone Encounter (Addendum)
 Copied from CRM [id number]. Topic: Clinical - Red Word Triage >> Aug 23, 2023 12:08 PM Marland Kitchen D wrote: Bad diarrhea, and vomiting feels dehydrated and cramping in the legs   Chief Complaint: Diarrhea Symptoms: nausea, vomiting. diarrhea, blood in stool Frequency: Ongoing since this morning Disposition: [x] ED /[] Urgent Care (no appt availability in office) / [] Appointment(In office/virtual)/ []  [place] [facility]/ [] Home Care/ [] Refused Recommended Disposition /[] [place] [facility]/ []  Follow-up with PCP Additional Notes: Patient stated that he is experiencing nausea, vomiting, and diarrhea. Diarrhea started around 4:30 am this morning and he had at least 15 episodes of diarrhea in large amounts. Two of his bowel movements contained blood, and there seemed to be a lot of blood in the toilet during one episode. Patient is also having mild abdominal pain. He reports have some leg cramping and he is feeling a little weakness. He had one episode of vomiting and is still nauseous. Patient was advised to be seen in the ED. Patient verbalized understanding and his partner will take him.   Reason for Disposition  [1] Blood in the stool AND [2] moderate or large amount of blood  Answer Assessment - Initial Assessment Questions 1. DIARRHEA SEVERITY: "How bad is the diarrhea?" "How many more stools have you had in the past 24 hours than normal?"    - NO DIARRHEA (SCALE 0)   - MILD (SCALE 1-3): Few loose or mushy BMs; increase of 1-3 stools over normal daily number of stools; mild increase in ostomy output.   -  MODERATE (SCALE 4-7): Increase of 4-6 stools daily over normal; moderate increase in ostomy output.   -  SEVERE (SCALE 8-10; OR "WORST POSSIBLE"): Increase of 7 or more stools daily over normal; moderate increase in ostomy output; incontinence.     Patient had 15-20 episodes of diarrhea today. He thinks it might be caused by something he ate.  2. ONSET: "When did the diarrhea begin?"       Today at 4:30 am  3. VOMITING: "Are you also vomiting?" If Yes, ask: "How many times in the past 24 hours?"      1 episode of vomiting at 9:30 am  4. ABDOMEN PAIN: "Are you having any abdomen pain?" If Yes, ask: "What does it feel like?" (e.g., crampy, dull, intermittent, constant)      2/10 abdominal pain  5. ORAL INTAKE: If vomiting, "Have you been able to drink liquids?" "How much liquids have you had in the past 24 hours?"     Able to keep down gingerale today  6. HYDRATION: "Any signs of dehydration?" (e.g., dry mouth [not just dry lips], too weak to stand, dizziness, new weight loss) "When did you last urinate?"     Cramping in legs (comes and goes). Patient is able to walk okay but feels a little weak  7. OTHER SYMPTOMS: "Do you have any other symptoms?" (e.g., fever, blood in stool)       Had blood in stool twice. At one point it looked like a lot of blood in the toilet bowl  Protocols used: [facility]

## 2023-09-05 ENCOUNTER — Other Ambulatory Visit: Payer: Self-pay

## 2023-09-05 DIAGNOSIS — E1140 Type 2 diabetes mellitus with diabetic neuropathy, unspecified: Secondary | ICD-10-CM

## 2023-09-05 DIAGNOSIS — Z0000 Encounter for general adult medical examination without abnormal findings: Secondary | ICD-10-CM

## 2023-09-05 DIAGNOSIS — E1169 Type 2 diabetes mellitus with other specified complication: Secondary | ICD-10-CM

## 2023-09-05 DIAGNOSIS — E559 Vitamin D deficiency, unspecified: Secondary | ICD-10-CM

## 2023-09-05 DIAGNOSIS — I10 Essential (primary) hypertension: Secondary | ICD-10-CM

## 2023-09-05 DIAGNOSIS — Z125 Encounter for screening for malignant neoplasm of prostate: Secondary | ICD-10-CM

## 2023-09-06 LAB — CBC WITH DIFFERENTIAL/PLATELET
Absolute Lymphocytes: 1343 {cells}/uL (ref 850–3900)
Absolute Monocytes: 578 {cells}/uL (ref 200–950)
Basophils Absolute: 68 {cells}/uL (ref 0–200)
Basophils Relative: 0.9 %
Eosinophils Absolute: 510 {cells}/uL — ABNORMAL HIGH (ref 15–500)
Eosinophils Relative: 6.8 %
HCT: 50.8 % — ABNORMAL HIGH (ref 38.5–50.0)
Hemoglobin: 16.7 g/dL (ref 13.2–17.1)
MCH: 28 pg (ref 27.0–33.0)
MCHC: 32.9 g/dL (ref 32.0–36.0)
MCV: 85.1 fL (ref 80.0–100.0)
MPV: 9.8 fL (ref 7.5–12.5)
Monocytes Relative: 7.7 %
Neutro Abs: 5003 {cells}/uL (ref 1500–7800)
Neutrophils Relative %: 66.7 %
Platelets: 274 10*3/uL (ref 140–400)
RBC: 5.97 10*6/uL — ABNORMAL HIGH (ref 4.20–5.80)
RDW: 13.6 % (ref 11.0–15.0)
Total Lymphocyte: 17.9 %
WBC: 7.5 10*3/uL (ref 3.8–10.8)

## 2023-09-06 LAB — COMPLETE METABOLIC PANEL WITHOUT GFR
AG Ratio: 1.7 (calc) (ref 1.0–2.5)
ALT: 14 U/L (ref 9–46)
AST: 12 U/L (ref 10–35)
Albumin: 4 g/dL (ref 3.6–5.1)
Alkaline phosphatase (APISO): 61 U/L (ref 35–144)
BUN: 10 mg/dL (ref 7–25)
CO2: 26 mmol/L (ref 20–32)
Calcium: 9.2 mg/dL (ref 8.6–10.3)
Chloride: 104 mmol/L (ref 98–110)
Creat: 0.84 mg/dL (ref 0.70–1.30)
Globulin: 2.3 g/dL (ref 1.9–3.7)
Glucose, Bld: 97 mg/dL (ref 65–99)
Potassium: 4.1 mmol/L (ref 3.5–5.3)
Sodium: 139 mmol/L (ref 135–146)
Total Bilirubin: 0.5 mg/dL (ref 0.2–1.2)
Total Protein: 6.3 g/dL (ref 6.1–8.1)

## 2023-09-06 LAB — PSA: PSA: 0.52 ng/mL (ref <=4.00)

## 2023-09-06 LAB — HEMOGLOBIN A1C
Hgb A1c MFr Bld: 6.1 %{Hb} — ABNORMAL HIGH (ref <5.7)
Mean Plasma Glucose: 128 mg/dL
eAG (mmol/L): 7.1 mmol/L

## 2023-09-06 LAB — LIPID PANEL
Cholesterol: 144 mg/dL (ref <200)
HDL: 49 mg/dL (ref >=40)
LDL Cholesterol (Calc): 77 mg/dL
Non-HDL Cholesterol (Calc): 95 mg/dL (ref <130)
Total CHOL/HDL Ratio: 2.9 (calc) (ref <5.0)
Triglycerides: 94 mg/dL (ref <150)

## 2023-09-06 LAB — VITAMIN D 25 HYDROXY (VIT D DEFICIENCY, FRACTURES): Vit D, 25-Hydroxy: 21 ng/mL — ABNORMAL LOW (ref 30–100)

## 2023-09-06 LAB — TSH: TSH: 1.55 m[IU]/L (ref 0.40–4.50)

## 2023-09-12 ENCOUNTER — Ambulatory Visit (INDEPENDENT_AMBULATORY_CARE_PROVIDER_SITE_OTHER): Payer: Self-pay | Admitting: Family Medicine

## 2023-09-12 ENCOUNTER — Encounter: Payer: Self-pay | Admitting: Family Medicine

## 2023-09-12 VITALS — BP 110/72 | HR 60 | Ht 73.0 in | Wt 334.0 lb

## 2023-09-12 DIAGNOSIS — I10 Essential (primary) hypertension: Secondary | ICD-10-CM

## 2023-09-12 DIAGNOSIS — E559 Vitamin D deficiency, unspecified: Secondary | ICD-10-CM

## 2023-09-12 DIAGNOSIS — E1169 Type 2 diabetes mellitus with other specified complication: Secondary | ICD-10-CM | POA: Diagnosis not present

## 2023-09-12 DIAGNOSIS — N529 Male erectile dysfunction, unspecified: Secondary | ICD-10-CM

## 2023-09-12 DIAGNOSIS — E1140 Type 2 diabetes mellitus with diabetic neuropathy, unspecified: Secondary | ICD-10-CM | POA: Diagnosis not present

## 2023-09-12 DIAGNOSIS — Z0000 Encounter for general adult medical examination without abnormal findings: Secondary | ICD-10-CM | POA: Diagnosis not present

## 2023-09-12 DIAGNOSIS — E785 Hyperlipidemia, unspecified: Secondary | ICD-10-CM

## 2023-09-12 DIAGNOSIS — Z7985 Long-term (current) use of injectable non-insulin antidiabetic drugs: Secondary | ICD-10-CM

## 2023-09-12 DIAGNOSIS — Z6841 Body Mass Index (BMI) 40.0 and over, adult: Secondary | ICD-10-CM

## 2023-09-12 MED ORDER — SILDENAFIL CITRATE 20 MG PO TABS
80.0000 mg | ORAL_TABLET | ORAL | 6 refills | Status: AC | PRN
Start: 2023-09-12 — End: ?

## 2023-09-12 MED ORDER — MOUNJARO 7.5 MG/0.5ML ~~LOC~~ SOAJ
7.5000 mg | SUBCUTANEOUS | 1 refills | Status: DC
Start: 2023-09-12 — End: 2023-12-14

## 2023-09-12 NOTE — Patient Instructions (Addendum)
 Thank you for coming to the office today.  Recent Labs     09/05/23 0806  HGBA1C  6.1*   Excellent work on the A1c, previously 6.8  Weight trend down 20 + lbs from 353 lbs down to 334 lbs in past 6 months.  We will be ready to plan for GI Referral in May/June 2025, as Dr Tobi Bastos and some of the other GI doctors are switching from [facility] over to [facility] [place]  Check back with [place] or them and see if they can assist with scheduling. May need a new referral.  [facility] [address] [place], [place] [address] Hours: 8AM-5PM Phone: [phone]  ------------  Upcoming Diabetic Eye Exam  Low Vitamin D 20 range Start OTC Vitamin D3 5,000 iu daily for 12 weeks then reduce to OTC Vitamin D3 2,000 iu daily for maintenance    You have been referred for a Coronary Calcium Score Cardiac CT Scan. This is a screening test for patients aged 26-50+ with cardiovascular risk factors or who are healthy but would be interested in Cardiovascular Screening for heart disease. Even if there is a family history of heart disease, this imaging can be useful. Typically it can be done every 5+ years or at a different timeline we agree on  The scan will look at the chest and mainly focus on the heart and identify early signs of calcium build up or blockages within the heart arteries. It is not 100% accurate for identifying blockages or heart disease, but it is useful to help [place] predict who may have some early changes or be at risk in the future for a heart attack or cardiovascular problem.  The results are reviewed by a Cardiologist and they will document the results. It should become available on MyChart. Typically the results are divided into percentiles based on other patients of the same demographic and age. So it will compare your risk to others similar to you. If you have a higher score >99 or higher percentile >75%tile, it is recommended to consider  Statin cholesterol therapy and or referral to Cardiologist. I will try to help explain your results and if we have questions we can contact the Cardiologist.  You will be contacted for scheduling. Usually it is done at any imaging facility through [facility], [facility] or [facility].  The cost is $99 flat fee total and it does not go through insurance, so no authorization is required.   Please schedule a Follow-up Appointment to: Return in about 6 months (around 03/13/2024) for 6 month DM A1c.  If you have any other questions or concerns, please feel free to call the office or send a message through MyChart. You may also schedule an earlier appointment if necessary.  Additionally, you may be receiving a survey about your experience at our office within a few days to 1 week by e-mail or mail. We value your feedback.  Saralyn Pilar, DO [facility], [place]

## 2023-09-12 NOTE — Progress Notes (Addendum)
 Subjective:    Patient ID: Martin Sanders, male    DOB: 09-27-1970, 53 y.o.   MRN: [id number]  Martin Sanders is a 53 y.o. male presenting on 09/12/2023 for Annual Exam and Diabetes   HPI  Discussed the use of AI scribe software for clinical note transcription with the patient, who gave verbal consent to proceed.  History of Present Illness   Martin Sanders is a 53 year old male who presents for an annual physical exam.       FOLLOW-UP CHRONIC DM, Type 2 with Mild Peripheral Neuropathy // Morbid Obesity  Controlled Diabetes A1c 6.1, improved lower range from 6.8 Home CBG 90 day avg 117 He has experienced a weight loss of approximately 20 pounds, going from 353 pounds to 334 pounds. He notes that his clothes are fitting differently, although he finds it 'aggravating' that he is not losing weight in certain areas.   Continues Mounjaro 7.5mg  weekly inj Continues Metformin XR 500mg  x2 = 1000mg , once daily Reports good compliance. 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 However, they note that it is starting to affect their vision, as expected.   Health Maintenance:  Ordered Colonoscopy referral previously, Dr Tobi Bastos [facility] - prior scheduled 12/2022 colonoscopy, cancelled due to life events and needs to be rescheduled. - Await upcoming plan to re order referral to [facility] 10/2023       09/12/2023    8:07 AM 03/07/2023    8:11 AM 08/30/2022   11:06 PM  Depression screen PHQ 2/9  Decreased Interest 0 0 0  Down,  Depressed, Hopeless 0 0 0  PHQ - 2 Score 0 0 0  Altered sleeping 0    Tired, decreased energy 0    Change in appetite 0    Feeling bad or failure about yourself  0    Trouble concentrating 0    Moving slowly or fidgety/restless 0    Suicidal thoughts 0    PHQ-9 Score 0         09/12/2023    8:07 AM 03/07/2023    8:11 AM 08/18/2021    8:06 AM 06/19/2021    1:35 PM  GAD 7 : Generalized Anxiety Score  Nervous, Anxious, on Edge 0 0 0 0  Control/stop worrying 0 0 0 0  Worry too much - different things 0 0 0 0  Trouble relaxing 0 0 0 0  Restless 0 0 0 0  Easily annoyed or irritable 0 0 0 0  Afraid - awful might happen 0 0 0 0  Total GAD 7 Score 0 0 0 0  Anxiety Difficulty   Not difficult at all Not difficult at all     Past Medical History:  Diagnosis Date  . Allergy    Past Surgical History:  Procedure Laterality Date  . KNEE ARTHROSCOPY AND ARTHROTOMY     Social History   Socioeconomic History  . Marital status: Married    Spouse name: Not on file  . Number of  children: Not on file  . Years of education: Not on file  . Highest education level: 12th grade  Occupational History  . Not on file  Tobacco Use  . Smoking status: Never  . Smokeless tobacco: Never  Substance and Sexual Activity  . Alcohol use: No    Comment: occasionally 2 glasses wine   . Drug use: No  . Sexual activity: Not on file  Other Topics Concern  . Not on file  Social History Narrative  . Not on file   Social Drivers of Health   Financial Resource Strain: Low Risk  (09/09/2023)   Overall Financial Resource Strain (CARDIA)   . Difficulty of Paying Living Expenses: Not hard at all  Food Insecurity: No Food Insecurity (09/09/2023)   Hunger Vital Sign   . Worried About Programme researcher, broadcasting/film/video in the Last Year: Never true   . Ran Out of Food in the Last Year: Never true  Transportation Needs: No Transportation Needs (09/09/2023)   PRAPARE - Transportation   . Lack of Transportation (Medical): No   .  Lack of Transportation (Non-Medical): No  Physical Activity: Insufficiently Active (09/09/2023)   Exercise Vital Sign   . Days of Exercise per Week: 4 days   . Minutes of Exercise per Session: 10 min  Stress: No Stress Concern Present (09/09/2023)   [employer] of Occupational Health - Occupational Stress Questionnaire   . Feeling of Stress : Not at all  Social Connections: Socially Integrated (09/09/2023)   Social Connection and Isolation Panel [NHANES]   . Frequency of Communication with Friends and Family: More than three times a week   . Frequency of Social Gatherings with Friends and Family: Twice a week   . Attends Religious Services: More than 4 times per year   . Active Member of Clubs or Organizations: Yes   . Attends Banker Meetings: More than 4 times per year   . Marital Status: Married  Catering manager Violence: Not on file   History reviewed. No pertinent family history. Current Outpatient Medications on File Prior to Visit  Medication Sig  . Accu-Chek FastClix Lancets MISC Check sugar up to 2 x daily  . Alpha-Lipoic Acid 100 MG CAPS Take 1 capsule (100 mg total) by mouth daily.  . baclofen (LIORESAL) 10 MG tablet Take 0.5-1 tablets (5-10 mg total) by mouth 3 (three) times daily as needed for muscle spasms.  . Blood Glucose Monitoring Suppl (CONTOUR NEXT EZ MONITOR) w/Device KIT 1 each by Does not apply route 2 (two) times daily. Dx E11.9  . Cholecalciferol (VITAMIN D3) 125 MCG (5000 UT) CAPS Take 5,000 Units by mouth once a week.  Marland Kitchen glucose blood (ACCU-CHEK GUIDE) test strip USE TO CHECK BLOOD SUGAR UP TO 2 X DAILY  . levocetirizine (XYZAL) 5 MG tablet Take by mouth.  . losartan (COZAAR) 25 MG tablet Take 1 tablet (25 mg total) by mouth daily.  . metFORMIN (GLUCOPHAGE-XR) 500 MG 24 hr tablet Take 1 tablet (500 mg total) by mouth 2 (two) times daily with a meal.  . mupirocin ointment (BACTROBAN) 2 % Apply 1 Application topically 2 (two) times daily. For up to  1-2 weeks as needed for skin sores.  Marland Kitchen SHINGRIX injection Inject 0.5 mL into arm for shingles vaccine, then repeat 2-6 months later for 2nd dose.  . celecoxib (CELEBREX) 200 MG capsule Take 1 tablet by mouth daily. (Patient not taking: Reported on 09/12/2023)   No current facility-administered medications on file  prior to visit.    Review of Systems  Constitutional:  Negative for activity change, appetite change, chills, diaphoresis, fatigue and fever.  HENT:  Negative for congestion and hearing loss.   Eyes:  Negative for visual disturbance.  Respiratory:  Negative for cough, chest tightness, shortness of breath and wheezing.   Cardiovascular:  Negative for chest pain, palpitations and leg swelling.  Gastrointestinal:  Negative for abdominal pain, constipation, diarrhea, nausea and vomiting.  Genitourinary:  Negative for dysuria, frequency and hematuria.  Musculoskeletal:  Negative for arthralgias and neck pain.  Skin:  Negative for rash.  Neurological:  Negative for dizziness, weakness, light-headedness, numbness and headaches.  Hematological:  Negative for adenopathy.  Psychiatric/Behavioral:  Negative for behavioral problems, dysphoric mood and sleep disturbance.    Per HPI unless specifically indicated above     Objective:    BP 110/72 (BP Location: Left Arm, Patient Position: Sitting, Cuff Size: Large)   Pulse 60   Ht 6\' 1"  (1.854 m)   Wt (!) 334 lb (151.5 kg)   SpO2 97%   BMI 44.07 kg/m   Wt Readings from Last 3 Encounters:  09/12/23 (!) 334 lb (151.5 kg)  03/07/23 (!) 353 lb (160.1 kg)  08/30/22 (!) 350 lb (158.8 kg)    Physical Exam Vitals and nursing note reviewed.  Constitutional:      General: He is not in acute distress.    Appearance: He is well-developed. He is obese. He is not diaphoretic.     Comments: Well-appearing, comfortable, cooperative  HENT:     Head: Normocephalic and atraumatic.  Eyes:     General:        Right eye: No discharge.        Left  eye: No discharge.     Conjunctiva/sclera: Conjunctivae normal.     Pupils: Pupils are equal, round, and reactive to light.  Neck:     Thyroid: No thyromegaly.     Vascular: No carotid bruit.  Cardiovascular:     Rate and Rhythm: Normal rate and regular rhythm.     Pulses: Normal pulses.     Heart sounds: Normal heart sounds. No murmur heard. Pulmonary:     Effort: Pulmonary effort is normal. No respiratory distress.     Breath sounds: Normal breath sounds. No wheezing or rales.  Abdominal:     General: Bowel sounds are normal. There is no distension.     Palpations: Abdomen is soft. There is no mass.     Tenderness: There is no abdominal tenderness.  Musculoskeletal:        General: No tenderness. Normal range of motion.     Cervical back: Normal range of motion and neck supple.     Right lower leg: No edema.     Left lower leg: No edema.     Comments: Upper / Lower Extremities: - Normal muscle tone, strength bilateral upper extremities 5/5, lower extremities 5/5  Lymphadenopathy:     Cervical: No cervical adenopathy.  Skin:    General: Skin is warm and dry.     Findings: No erythema or rash.  Neurological:     Mental Status: He is alert and oriented to person, place, and time.     Comments: Distal sensation intact to light touch all extremities  Psychiatric:        Mood and Affect: Mood normal.        Behavior: Behavior normal.        Thought Content: Thought content normal.  Comments: Well groomed, good eye contact, normal speech and thoughts    Diabetic Foot Exam - Simple   Simple Foot Form Diabetic Foot exam was performed with the following findings: Yes 09/12/2023  8:13 AM  Visual Inspection No deformities, no ulcerations, no other skin breakdown bilaterally: Yes Sensation Testing Intact to touch and monofilament testing bilaterally: Yes Pulse Check Posterior Tibialis and Dorsalis pulse intact bilaterally: Yes Comments      Results for orders placed or  performed in visit on 09/05/23  VITAMIN D 25 Hydroxy (Vit-D Deficiency, Fractures)   Collection Time: 09/05/23  8:06 AM  Result Value Ref Range   Vit D, 25-Hydroxy 21 (L) 30 - 100 ng/mL  TSH   Collection Time: 09/05/23  8:06 AM  Result Value Ref Range   TSH 1.55 0.40 - 4.50 mIU/L  PSA   Collection Time: 09/05/23  8:06 AM  Result Value Ref Range   PSA 0.52 < OR = 4.00 ng/mL  CBC with Differential/Platelet   Collection Time: 09/05/23  8:06 AM  Result Value Ref Range   WBC 7.5 3.8 - 10.8 Thousand/uL   RBC 5.97 (H) 4.20 - 5.80 Million/uL   Hemoglobin 16.7 13.2 - 17.1 g/dL   HCT 16.1 (H) 09.6 - 04.5 %   MCV 85.1 80.0 - 100.0 fL   MCH 28.0 27.0 - 33.0 pg   MCHC 32.9 32.0 - 36.0 g/dL   RDW 40.9 81.1 - 91.4 %   Platelets 274 140 - 400 Thousand/uL   MPV 9.8 7.5 - 12.5 fL   Neutro Abs 5,003 1,500 - 7,800 cells/uL   Absolute Lymphocytes 1,343 850 - 3,900 cells/uL   Absolute Monocytes 578 200 - 950 cells/uL   Eosinophils Absolute 510 (H) 15 - 500 cells/uL   Basophils Absolute 68 0 - 200 cells/uL   Neutrophils Relative % 66.7 %   Total Lymphocyte 17.9 %   Monocytes Relative 7.7 %   Eosinophils Relative 6.8 %   Basophils Relative 0.9 %  COMPLETE METABOLIC PANEL WITH GFR   Collection Time: 09/05/23  8:06 AM  Result Value Ref Range   Glucose, Bld 97 65 - 99 mg/dL   BUN 10 7 - 25 mg/dL   Creat 7.82 9.56 - 2.13 mg/dL   BUN/Creatinine Ratio SEE NOTE: 6 - 22 (calc)   Sodium 139 135 - 146 mmol/L   Potassium 4.1 3.5 - 5.3 mmol/L   Chloride 104 98 - 110 mmol/L   CO2 26 20 - 32 mmol/L   Calcium 9.2 8.6 - 10.3 mg/dL   Total Protein 6.3 6.1 - 8.1 g/dL   Albumin 4.0 3.6 - 5.1 g/dL   Globulin 2.3 1.9 - 3.7 g/dL (calc)   AG Ratio 1.7 1.0 - 2.5 (calc)   Total Bilirubin 0.5 0.2 - 1.2 mg/dL   Alkaline phosphatase (APISO) 61 35 - 144 U/L   AST 12 10 - 35 U/L   ALT 14 9 - 46 U/L  Lipid panel   Collection Time: 09/05/23  8:06 AM  Result Value Ref Range   Cholesterol 144 <200 mg/dL   HDL 49 >  OR = 40 mg/dL   Triglycerides 94 <086 mg/dL   LDL Cholesterol (Calc) 77 mg/dL (calc)   Total CHOL/HDL Ratio 2.9 <5.0 (calc)   Non-HDL Cholesterol (Calc) 95 <578 mg/dL (calc)  Hemoglobin I6N   Collection Time: 09/05/23  8:06 AM  Result Value Ref Range   Hgb A1c MFr Bld 6.1 (H) <5.7 % of total Hgb  Mean Plasma Glucose 128 mg/dL   eAG (mmol/L) 7.1 mmol/L      Assessment & Plan:   Problem List Items Addressed This Visit     Controlled type 2 diabetes mellitus with diabetic neuropathy (HCC)   Relevant Medications   MOUNJARO 7.5 MG/0.5ML Pen   Other Relevant Orders   CT CARDIAC SCORING (SELF PAY ONLY)   ED (erectile dysfunction)   Relevant Medications   sildenafil (REVATIO) 20 MG tablet   Essential hypertension   Relevant Medications   sildenafil (REVATIO) 20 MG tablet   Other Relevant Orders   CT CARDIAC SCORING (SELF PAY ONLY)   Hyperlipidemia associated with type 2 diabetes mellitus (HCC)   Relevant Medications   MOUNJARO 7.5 MG/0.5ML Pen   sildenafil (REVATIO) 20 MG tablet   Other Relevant Orders   CT CARDIAC SCORING (SELF PAY ONLY)   Morbid obesity with BMI of 40.0-44.9, adult (HCC)   Relevant Medications   MOUNJARO 7.5 MG/0.5ML Pen   Vitamin D deficiency   Other Visit Diagnoses       Annual physical exam    -  Primary     Long-term current use of injectable noninsulin antidiabetic medication            Updated Health Maintenance information Reviewed recent lab results with patient Encouraged improvement to lifestyle with diet and exercise Goal of weight loss   Type 2 Diabetes Mellitus Improved glycemic control with Hemoglobin A1c reduced to 6.1%. Effective management with Mounjaro 7.5 mg and 20-pound weight loss. - Continue Mounjaro 7.5 mg. - Evaluate need to increase Mounjaro to 10 mg at next visit. - Schedule diabetic eye exam on October 01, 2023. - Monitor blood glucose levels regularly.  Morbid Obesity Significant weight loss of 20 pounds positively  impacting diabetes management. - Continue current weight management strategies. - Encourage continued weight loss efforts.  Hypertension Well-controlled with current therapy, BP 110/72 mmHg. - Continue current antihypertensive therapy.  General Health Maintenance Routine health maintenance and preventive care discussed. Colonoscopy and CT scan pending. Pneumonia vaccination deferred. Vitamin D supplementation recommended. - Schedule colonoscopy with GI referral in May or June 2025. - Order CT scan for cardiovascular screening. - Defer pneumonia vaccination for now. - Recommend vitamin D supplementation: 5000 IU daily for 12 weeks, then 2000 IU for maintenance.  Follow-up Follow-up plans for ongoing management and preventive care discussed.       Orders Placed This Encounter  Procedures  . CT CARDIAC SCORING (SELF PAY ONLY)    Standing Status:   Future    Expiration Date:   09/11/2024    Preferred imaging location?:   [facility]    Meds ordered this encounter  Medications  . MOUNJARO 7.5 MG/0.5ML Pen    Sig: Inject 7.5 mg into the skin once a week.    Dispense:  6 mL    Refill:  1    84 day  . sildenafil (REVATIO) 20 MG tablet    Sig: Take 4 tablets (80 mg total) by mouth as needed (prior to sex).    Dispense:  50 tablet    Refill:  6     Follow up plan: Return in about 6 months (around 03/13/2024) for 6 month DM A1c.  Saralyn Pilar, DO [facility] 09/12/2023, 8:09 AM

## 2023-10-07 ENCOUNTER — Other Ambulatory Visit

## 2023-10-28 ENCOUNTER — Other Ambulatory Visit

## 2023-12-12 ENCOUNTER — Other Ambulatory Visit: Payer: Self-pay | Admitting: Family Medicine

## 2023-12-12 DIAGNOSIS — E1140 Type 2 diabetes mellitus with diabetic neuropathy, unspecified: Secondary | ICD-10-CM

## 2023-12-14 NOTE — Telephone Encounter (Signed)
 Requested Prescriptions  Pending Prescriptions Disp Refills   MOUNJARO  7.5 MG/0.5ML Pen [Pharmacy Med Name: MOUNJARO  7.5 MG/0.5 ML PEN] 6 mL 1    Sig: INJECT 7.5 MG SUBCUTANEOUSLY WEEKLY     There is no refill protocol information for this order

## 2024-02-14 ENCOUNTER — Encounter: Payer: Self-pay | Admitting: Family Medicine

## 2024-03-11 ENCOUNTER — Other Ambulatory Visit: Payer: Self-pay | Admitting: Family Medicine

## 2024-03-11 DIAGNOSIS — I10 Essential (primary) hypertension: Secondary | ICD-10-CM

## 2024-03-11 DIAGNOSIS — E1140 Type 2 diabetes mellitus with diabetic neuropathy, unspecified: Secondary | ICD-10-CM

## 2024-03-13 ENCOUNTER — Other Ambulatory Visit: Payer: Self-pay | Admitting: Family Medicine

## 2024-03-13 ENCOUNTER — Encounter: Payer: Self-pay | Admitting: Family Medicine

## 2024-03-13 ENCOUNTER — Ambulatory Visit: Admitting: Family Medicine

## 2024-03-13 VITALS — BP 124/72 | HR 71 | Ht 73.0 in | Wt 305.1 lb

## 2024-03-13 DIAGNOSIS — E1140 Type 2 diabetes mellitus with diabetic neuropathy, unspecified: Secondary | ICD-10-CM

## 2024-03-13 DIAGNOSIS — I10 Essential (primary) hypertension: Secondary | ICD-10-CM | POA: Diagnosis not present

## 2024-03-13 DIAGNOSIS — Z7985 Long-term (current) use of injectable non-insulin antidiabetic drugs: Secondary | ICD-10-CM | POA: Diagnosis not present

## 2024-03-13 DIAGNOSIS — Z6841 Body Mass Index (BMI) 40.0 and over, adult: Secondary | ICD-10-CM

## 2024-03-13 DIAGNOSIS — Z125 Encounter for screening for malignant neoplasm of prostate: Secondary | ICD-10-CM

## 2024-03-13 DIAGNOSIS — E559 Vitamin D deficiency, unspecified: Secondary | ICD-10-CM

## 2024-03-13 DIAGNOSIS — Z23 Encounter for immunization: Secondary | ICD-10-CM

## 2024-03-13 DIAGNOSIS — Z0000 Encounter for general adult medical examination without abnormal findings: Secondary | ICD-10-CM

## 2024-03-13 LAB — POCT GLYCOSYLATED HEMOGLOBIN (HGB A1C): Hemoglobin A1C: 5.6 % (ref 4.0–5.6)

## 2024-03-13 MED ORDER — MOUNJARO 7.5 MG/0.5ML ~~LOC~~ SOAJ: 7.5000 mg | SUBCUTANEOUS | 1 refills | Status: AC

## 2024-03-13 MED ORDER — METFORMIN HCL ER 500 MG PO TB24: 500.0000 mg | ORAL_TABLET | Freq: Two times a day (BID) | ORAL | 3 refills | Status: AC

## 2024-03-13 MED ORDER — LOSARTAN POTASSIUM 25 MG PO TABS: 25.0000 mg | ORAL_TABLET | ORAL | 3 refills | Status: AC

## 2024-03-13 NOTE — Progress Notes (Signed)
 Subjective:    Patient ID: Martin Sanders, male    DOB: February 04, 1971, 53 y.o.   MRN: [id number]  Martin Sanders is a 53 y.o. male presenting on 03/13/2024 for Diabetes   HPI  Discussed the use of AI scribe software for clinical note transcription with the patient, who gave verbal consent to proceed.  History of Present Illness   Martin Sanders is a 53 year old male with type 2 diabetes who presents for a six-month follow-up and blood sugar check.   Weight loss and obesity - Current weight 305 pounds, decreased from 334 pounds six months ago and 353 pounds one year ago - Clothing size has decreased - Weight loss is noticeable to others  Cough associated with antihypertensive therapy - Persistent cough described as a 'tickle' - Cough is bothersome to his wife - History of similar cough with prior use of lisinopril  - Currently taking losartan   Immunizations and preventive health maintenance - Received influenza vaccine recently - Received first dose of shingles vaccine; second dose scheduled for October 14th - Plans to schedule a colonoscopy       FOLLOW-UP CHRONIC DM, Type 2 with Mild Peripheral Neuropathy // Morbid Obesity  Controlled Diabetes  A1c down to 5.6, from prior 6.1 He has continued same dose Mounjaro  for the past 6+ months and has had continued success Home CBG 90 day of 107 Admits some breakthrough digestive symptoms with stomach acid and burps sour stomach with the dose, not every dose. He does take it with full stomach and can have symptoms for 1.5 days approx if this occurs. But not every dose. Continues significant weight loss in 1 year, from 353 lbs down in 6 months down 20 lbs to 334 lbs then in 6 months til now with 30 more lb weight loss to 305 lbs.  Continues Mounjaro  7.5mg  weekly inj Continues Metformin  XR 500mg  x2 = 1000mg , once daily Reports good compliance. 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, and recent reports indicate a decrease in the size of the melanoma. However, they note that it is starting to affect their vision, as expected.     Health Maintenance:   Ordered Colonoscopy referral previously, Dr Therisa GI - prior scheduled 12/2022 colonoscopy, cancelled due to life events and needs to be rescheduled. - Await upcoming plan to re order re-schedule  Flu Shot today  Shingles vaccine 1st dose August 2025, then 2nd dose 03/20/24     03/13/2024    8:13 AM 09/12/2023    8:07 AM 03/07/2023    8:11 AM  Depression screen PHQ 2/9  Decreased Interest 0 0 0  Down, Depressed, Hopeless 0 0 0  PHQ - 2 Score 0 0 0  Altered sleeping  0   Tired, decreased energy  0   Change in appetite  0   Feeling bad or failure about yourself   0   Trouble concentrating  0   Moving slowly or fidgety/restless  0   Suicidal thoughts  0   PHQ-9 Score  0        03/13/2024    8:13 AM 09/12/2023    8:07 AM 03/07/2023  8:11 AM 08/18/2021    8:06 AM  GAD 7 : Generalized Anxiety Score  Nervous, Anxious, on Edge 0 0 0 0  Control/stop worrying 0 0 0 0  Worry too much - different things 0 0 0 0  Trouble relaxing 0 0 0 0  Restless 0 0 0 0  Easily annoyed or irritable 0 0 0 0  Afraid - awful might happen 0 0 0 0  Total GAD 7 Score 0 0 0 0  Anxiety Difficulty    Not difficult at all    Social History   Tobacco Use   Smoking status: Never   Smokeless tobacco: Never  Substance Use Topics   Alcohol use: No    Comment: occasionally 2 glasses wine    Drug use: No    Review of Systems Per HPI unless specifically indicated above     Objective:    BP 124/72 (BP Location: Right Arm, Patient Position: Sitting, Cuff Size:  Large)   Pulse 71   Ht 6' 1 (1.854 m)   Wt (!) 305 lb 2 oz (138.4 kg)   SpO2 97%   BMI 40.26 kg/m   Wt Readings from Last 3 Encounters:  03/13/24 (!) 305 lb 2 oz (138.4 kg)  09/12/23 (!) 334 lb (151.5 kg)  03/07/23 (!) 353 lb (160.1 kg)    Physical Exam Vitals and nursing note reviewed.  Constitutional:      General: He is not in acute distress.    Appearance: He is well-developed. He is obese. He is not diaphoretic.     Comments: Well-appearing, comfortable, cooperative  HENT:     Head: Normocephalic and atraumatic.  Eyes:     General:        Right eye: No discharge.        Left eye: No discharge.     Conjunctiva/sclera: Conjunctivae normal.  Neck:     Thyroid: No thyromegaly.  Cardiovascular:     Rate and Rhythm: Normal rate and regular rhythm.     Pulses: Normal pulses.     Heart sounds: Normal heart sounds. No murmur heard. Pulmonary:     Effort: Pulmonary effort is normal. No respiratory distress.     Breath sounds: Normal breath sounds. No wheezing or rales.  Musculoskeletal:        General: Normal range of motion.     Cervical back: Normal range of motion and neck supple.  Lymphadenopathy:     Cervical: No cervical adenopathy.  Skin:    General: Skin is warm and dry.     Findings: No erythema or rash.  Neurological:     Mental Status: He is alert and oriented to person, place, and time. Mental status is at baseline.  Psychiatric:        Behavior: Behavior normal.     Comments: Well groomed, good eye contact, normal speech and thoughts     Results for orders placed or performed in visit on 03/13/24  POCT HgB A1C   Collection Time: 03/13/24  8:18 AM  Result Value Ref Range   Hemoglobin A1C 5.6 4.0 - 5.6 %   HbA1c POC (<> result, manual entry)     HbA1c, POC (prediabetic range)     HbA1c, POC (controlled diabetic range)        Assessment & Plan:   Problem List Items Addressed This Visit     Essential hypertension   Relevant Medications   losartan   (COZAAR ) 25 MG tablet   Morbid obesity  with BMI of 40.0-44.9, adult (HCC)   Relevant Medications   MOUNJARO  7.5 MG/0.5ML Pen   metFORMIN  (GLUCOPHAGE -XR) 500 MG 24 hr tablet   Other Visit Diagnoses       Controlled type 2 diabetes mellitus with diabetic neuropathy, without long-term current use of insulin (HCC)    -  Primary   Relevant Medications   losartan  (COZAAR ) 25 MG tablet   MOUNJARO  7.5 MG/0.5ML Pen   metFORMIN  (GLUCOPHAGE -XR) 500 MG 24 hr tablet   Other Relevant Orders   POCT HgB A1C (Completed)     Flu vaccine need       Relevant Orders   Flu vaccine trivalent PF, 6mos and older(Flulaval,Afluria,Fluarix,Fluzone) (Completed)     Controlled type 2 diabetes mellitus with diabetic neuropathy, without long-term current use of insulin (HCC)   (Chronic)     Relevant Medications   losartan  (COZAAR ) 25 MG tablet   MOUNJARO  7.5 MG/0.5ML Pen   metFORMIN  (GLUCOPHAGE -XR) 500 MG 24 hr tablet   Other Relevant Orders   POCT HgB A1C (Completed)     Long-term current use of injectable noninsulin antidiabetic medication            Type 2 diabetes mellitus Comorbid Obesity, Hypertension Diabetes well-controlled with A1c of 5.6. Current regimen effective. Occasional digestive symptoms with Mounjaro  do not require dosage change. - Continue Mounjaro  7.5 mg with refills. Defer dose increase given significant A1c control and weight loss - Continue metformin  extended release 500 mg, one pill twice a day.  Morbid Obesity BMI >40 Complications with type 2 diabetes Significant weight loss from 353 lbs to 305 lbs. Positive impact on diabetes and hypertension management. - Continue current weight management plan including Mounjaro  and lifestyle modifications.  Hypertension Managed with losartan . Mild persistent cough noted. Weight loss may reduce antihypertensive needs. - Adjust losartan  to 25 mg every other day and monitor for improvement in cough. - Monitor blood pressure regularly to ensure  it remains controlled.        Orders Placed This Encounter  Procedures   Flu vaccine trivalent PF, 6mos and older(Flulaval,Afluria,Fluarix,Fluzone)   POCT HgB A1C    Meds ordered this encounter  Medications   losartan  (COZAAR ) 25 MG tablet    Sig: Take 1 tablet (25 mg total) by mouth every other day.    Dispense:  45 tablet    Refill:  3   MOUNJARO  7.5 MG/0.5ML Pen    Sig: Inject 7.5 mg into the skin once a week.    Dispense:  6 mL    Refill:  1    Add refills for future   metFORMIN  (GLUCOPHAGE -XR) 500 MG 24 hr tablet    Sig: Take 1 tablet (500 mg total) by mouth 2 (two) times daily with a meal.    Dispense:  180 tablet    Refill:  3    Add refills    Follow up plan: Return in about 6 months (around 09/11/2024) for 6 month fasting lab > 1 week later Annual Physical.  Future labs ordered for 09/12/24   Marsa Officer, DO [facility] 03/13/2024, 8:30 AM

## 2024-03-13 NOTE — Patient Instructions (Addendum)
 Thank you for coming to the office today.  Recent Labs    09/05/23 0806 03/13/24 0818  HGBA1C 6.1* 5.6   Keep up the great work  Refills on Mounjaro  7.5  Refills added.  For Colonoscopy scheduling, please call [facility]  [facility] [address] [place], [place] [address] Hours: 8AM-5PM Phone: [phone]  DUE for FASTING BLOOD WORK (no food or drink after midnight before the lab appointment, only water or coffee without cream/sugar on the morning of)  SCHEDULE Lab Only visit in the morning at the clinic for lab draw in 6 MONTHS   - Make sure Lab Only appointment is at about 1 week before your next appointment, so that results will be available  For Lab Results, once available within 2-3 days of blood draw, you can can log in to MyChart online to view your results and a brief explanation. Also, we can discuss results at next follow-up visit.   Please schedule a Follow-up Appointment to: Return in about 6 months (around 09/11/2024) for 6 month fasting lab > 1 week later Annual Physical.  If you have any other questions or concerns, please feel free to call the office or send a message through MyChart. You may also schedule an earlier appointment if necessary.  Additionally, you may be receiving a survey about your experience at our office within a few days to 1 week by e-mail or mail. We value your feedback.  Marsa Officer, DO [facility], [place]

## 2024-05-15 ENCOUNTER — Other Ambulatory Visit (HOSPITAL_COMMUNITY): Payer: Self-pay

## 2024-05-15 ENCOUNTER — Telehealth: Payer: Self-pay

## 2024-05-15 NOTE — Telephone Encounter (Signed)
 Pharmacy Patient Advocate Encounter   Received notification from [employer] that prior authorization for Mounjaro  7.5 is required/requested.   Insurance verification completed.   The patient is insured through [employer].   Per test claim: The current 28 day co-pay is, $50.00.  No PA needed at this time. This test claim was processed through [facility]- copay amounts may vary at other pharmacies due to pharmacy/plan contracts, or as the patient moves through the different stages of their insurance plan.     Patient current PA approval expires 06/12/24. Will submit for renewal closer to that time

## 2024-05-22 ENCOUNTER — Other Ambulatory Visit (HOSPITAL_COMMUNITY): Payer: Self-pay

## 2024-06-19 LAB — OPHTHALMOLOGY REPORT-SCANNED

## 2024-06-25 ENCOUNTER — Other Ambulatory Visit (HOSPITAL_COMMUNITY): Payer: Self-pay

## 2024-06-25 ENCOUNTER — Telehealth: Payer: Self-pay

## 2024-07-06 ENCOUNTER — Other Ambulatory Visit (HOSPITAL_COMMUNITY): Payer: Self-pay

## 2024-07-06 NOTE — Telephone Encounter (Signed)
 Pharmacy Patient Advocate Encounter  Received notification from [facility] that Prior Authorization for Mounjaro  7.5 has been APPROVED from 05/26/24 to 06/25/25. Unable to obtain price due to refill too soon rejection, last fill date 06/27/24 next available fill date3/24/26   PA #/Case ID/Reference #: # [id number]

## 2024-09-12 ENCOUNTER — Other Ambulatory Visit

## 2024-09-19 ENCOUNTER — Encounter: Admitting: Family Medicine
